# Patient Record
Sex: Male | Born: 1960
Health system: Southern US, Community
[De-identification: ages and names within clinical notes are randomized; demographics above are authoritative.]

## PROBLEM LIST (undated history)

## (undated) DIAGNOSIS — K219 Gastro-esophageal reflux disease without esophagitis: Secondary | ICD-10-CM

## (undated) DIAGNOSIS — G4733 Obstructive sleep apnea (adult) (pediatric): Secondary | ICD-10-CM

## (undated) DIAGNOSIS — E66812 Obesity, class 2: Secondary | ICD-10-CM

## (undated) DIAGNOSIS — E669 Obesity, unspecified: Secondary | ICD-10-CM

## (undated) DIAGNOSIS — K429 Umbilical hernia without obstruction or gangrene: Secondary | ICD-10-CM

## (undated) DIAGNOSIS — R0789 Other chest pain: Secondary | ICD-10-CM

## (undated) DIAGNOSIS — E291 Testicular hypofunction: Secondary | ICD-10-CM

## (undated) DIAGNOSIS — M51369 Other intervertebral disc degeneration, lumbar region without mention of lumbar back pain or lower extremity pain: Secondary | ICD-10-CM

## (undated) DIAGNOSIS — I10 Essential (primary) hypertension: Secondary | ICD-10-CM

## (undated) DIAGNOSIS — M5136 Other intervertebral disc degeneration, lumbar region: Secondary | ICD-10-CM

## (undated) DIAGNOSIS — Z800 Family history of malignant neoplasm of digestive organs: Secondary | ICD-10-CM

## (undated) DIAGNOSIS — Z86010 Personal history of colonic polyps: Secondary | ICD-10-CM

## (undated) DIAGNOSIS — Z860101 Personal history of adenomatous and serrated colon polyps: Secondary | ICD-10-CM

## (undated) DIAGNOSIS — F329 Major depressive disorder, single episode, unspecified: Secondary | ICD-10-CM

## (undated) DIAGNOSIS — F419 Anxiety disorder, unspecified: Secondary | ICD-10-CM

## (undated) DIAGNOSIS — R5382 Chronic fatigue, unspecified: Secondary | ICD-10-CM

## (undated) DIAGNOSIS — M25571 Pain in right ankle and joints of right foot: Secondary | ICD-10-CM

## (undated) DIAGNOSIS — E785 Hyperlipidemia, unspecified: Secondary | ICD-10-CM

## (undated) HISTORY — DX: Pain in right ankle and joints of right foot: M25.571

## (undated) HISTORY — DX: Obesity, class 2: E66.812

## (undated) HISTORY — DX: Essential (primary) hypertension: I10

## (undated) HISTORY — DX: Other intervertebral disc degeneration, lumbar region without mention of lumbar back pain or lower extremity pain: M51.369

## (undated) HISTORY — PX: COLONOSCOPY: SHX174

## (undated) HISTORY — DX: Anxiety disorder, unspecified: F41.9

## (undated) HISTORY — DX: Obstructive sleep apnea (adult) (pediatric): G47.33

## (undated) HISTORY — DX: Obesity, unspecified: E66.9

## (undated) HISTORY — DX: Personal history of colonic polyps: Z86.010

## (undated) HISTORY — DX: Testicular hypofunction: E29.1

## (undated) HISTORY — DX: Major depressive disorder, single episode, unspecified: F32.9

## (undated) HISTORY — DX: Personal history of adenomatous and serrated colon polyps: Z86.0101

## (undated) HISTORY — DX: Other intervertebral disc degeneration, lumbar region: M51.36

## (undated) HISTORY — DX: Gastro-esophageal reflux disease without esophagitis: K21.9

## (undated) HISTORY — PX: COLONOSCOPY W/ POLYPECTOMY: SHX1380

## (undated) HISTORY — DX: Hyperlipidemia, unspecified: E78.5

## (undated) HISTORY — DX: Umbilical hernia without obstruction or gangrene: K42.9

## (undated) HISTORY — DX: Chronic fatigue, unspecified: R53.82

## (undated) HISTORY — DX: Family history of malignant neoplasm of digestive organs: Z80.0

## (undated) HISTORY — PX: POLYPECTOMY: SHX149

---
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
## 2013-09-16 LAB — HM COLONOSCOPY

## 2015-03-06 ENCOUNTER — Ambulatory Visit: Payer: Managed Care, Other (non HMO)

## 2015-12-28 ENCOUNTER — Ambulatory Visit (INDEPENDENT_AMBULATORY_CARE_PROVIDER_SITE_OTHER): Payer: Managed Care, Other (non HMO) | Admitting: Family Medicine

## 2015-12-28 ENCOUNTER — Encounter: Payer: Self-pay | Admitting: Family Medicine

## 2015-12-28 VITALS — BP 120/83 | HR 65 | Temp 98.4°F | Resp 16 | Ht 71.5 in | Wt 252.5 lb

## 2015-12-28 DIAGNOSIS — Z13220 Encounter for screening for lipoid disorders: Secondary | ICD-10-CM

## 2015-12-28 DIAGNOSIS — E291 Testicular hypofunction: Secondary | ICD-10-CM

## 2015-12-28 DIAGNOSIS — E2740 Unspecified adrenocortical insufficiency: Secondary | ICD-10-CM

## 2015-12-28 DIAGNOSIS — R5382 Chronic fatigue, unspecified: Secondary | ICD-10-CM

## 2015-12-28 DIAGNOSIS — S39012A Strain of muscle, fascia and tendon of lower back, initial encounter: Secondary | ICD-10-CM

## 2015-12-28 MED ORDER — "SYRINGE/NEEDLE (DISP) 23G X 1"" 3 ML MISC": 1.0000 | Status: DC

## 2015-12-28 MED ORDER — TESTOSTERONE CYPIONATE 200 MG/ML IM SOLN: INTRAMUSCULAR | Status: DC

## 2015-12-28 MED ORDER — CYCLOBENZAPRINE HCL 10 MG PO TABS: 10.0000 mg | ORAL_TABLET | Freq: Three times a day (TID) | ORAL | Status: DC | PRN

## 2015-12-28 NOTE — Progress Notes (Signed)
Pre visit review using our clinic review tool, if applicable. No additional management support is needed unless otherwise documented below in the visit note. 

## 2015-12-28 NOTE — Progress Notes (Signed)
Office Note 12/31/2015  CC:  Chief Complaint  Patient presents with  . Establish Care  . Annual Exam    Pt is not fasting.     HPI:  Joshua Mcconnell is a 55 y.o.  male who is here to establish care and discuss hypogonadism. Patient's most recent primary MD: Dr. Virgilio Frees at [facility] in [place]. Old records were not reviewed prior to or during today's visit.  1) C/o generalized fatigue, has not seen an MD in a couple years but has been on testosterone replacement as well as hydrocortisone for presumed adrenal insufficiency but pt not exactly sure b/c he was seeing an integrated [facility] doctor who also believed that "everything wrong with you was due to some sort of imbalance in your vitamins and minerals"--per pt report today.  So, not so sure about how well the w/u for adrenal insufficiency was, etc.  He has not been on the hydrocortisone in the last 6 mo.  Has chronic LBP he wants to make me aware of: at least 1-2 years of diffuse LB pain w/out radiculopathy or paresthesias, says he has hx of DDD at L4-5, went through PT at [facility] orth[facility] in the past.  Says he was recently working out at [employer] and felt like he strained L low back again--mild.  Advil alt/w tylenol has been somewhat helpful but he still feels a bothersome tightness in L paraspinous region.  Past Medical History  Diagnosis Date  . Depression   . Hyperlipidemia   . Hypertension   . History of colon polyps   . DDD (degenerative disc disease), lumbar     L4-5 per pt (did PT at [facility])  . Hypogonadism male   . Chronic fatigue     History reviewed. No pertinent past surgical history.  Family History  Problem Relation Age of Onset  . Diabetes Mother   . Arthritis Mother   . Breast cancer Mother   . Heart disease Father   . Stroke Father   . Hypertension Father   . Colon cancer Sister 75  . Arthritis Maternal Grandmother     Social History   Social History  . Marital Status: Married    Spouse Name:  N/A  . Number of Children: N/A  . Years of Education: N/A   Occupational History  . Not on file.   Social History Main Topics  . Smoking status: Never Smoker   . Smokeless tobacco: Never Used  . Alcohol Use: 12.0 oz/week    10 Cans of beer, 10 Standard drinks or equivalent per week  . Drug Use: No  . Sexual Activity: Not on file   Other Topics Concern  . Not on file   Social History Narrative   Married, 2 daughters.   Educ: college 4 yrs   Occupation: [employer] of [employer])   No tobacco.   Alc: 1-2 drinks most days.       Outpatient Encounter Prescriptions as of 12/28/2015  Medication Sig  . hydrocortisone (CORTEF) 20 MG tablet Take 20 mg by mouth 4 (four) times daily. Per pt's integrated care MD [place]  . cyclobenzaprine (FLEXERIL) 10 MG tablet Take 1 tablet (10 mg total) by mouth 3 (three) times daily as needed for muscle spasms.  . SYRINGE-NEEDLE, DISP, 3 ML 23G X 1" 3 ML MISC 1 each by Does not apply route once a week.  . testosterone cypionate (DEPOTESTOSTERONE CYPIONATE) 200 MG/ML injection 1 ml IM q  week  . [DISCONTINUED] cyclobenzaprine (FLEXERIL) 5 MG tablet Take 5 mg by mouth 3 (three) times daily as needed for muscle spasms. Reported on 12/28/2015  . [DISCONTINUED] HYDROcodone-acetaminophen (NORCO/VICODIN) 5-325 MG per tablet Take 1 tablet by mouth every 6 (six) hours as needed for moderate pain. Reported on 12/28/2015  . [DISCONTINUED] naproxen (NAPRELAN) 500 MG 24 hr tablet Take 500 mg by mouth daily with breakfast. Reported on 12/28/2015   No facility-administered encounter medications on file as of 12/28/2015.    No Known Allergies  ROS Review of Systems  Constitutional: Negative for fever and fatigue.  HENT: Negative for congestion and sore throat.   Eyes: Negative for visual disturbance.  Respiratory: Negative for cough.   Cardiovascular: Negative for chest pain.  Gastrointestinal: Negative for nausea and abdominal pain.   Genitourinary: Negative for dysuria.  Musculoskeletal: Negative for back pain and joint swelling.  Skin: Negative for rash.       Reddish/purplish splotch of rash on L side of neck  Neurological: Negative for weakness and headaches.       Left elbow"funny bone" paresthesia-type sx's  Hematological: Negative for adenopathy.    PE; Blood pressure 120/83, pulse 65, temperature 98.4 F (36.9 C), temperature source Oral, resp. rate 16, height 5' 11.5" (1.816 m), weight 252 lb 8 oz (114.533 kg), SpO2 96 %. Gen: Alert, well appearing.  Patient is oriented to person, place, time, and situation. [id number]: no injection, icteris, swelling, or exudate.  EOMI, PERRLA. Mouth: lips without lesion/swelling.  Oral mucosa pink and moist. Oropharynx without erythema, exudate, or swelling.  Neck - No masses or thyromegaly or limitation in range of motion CV: RRR, no m/r/g.   LUNGS: CTA bilat, nonlabored resps, good aeration in all lung fields. ABD: soft, NT/ND EXT: no clubbing, cyanosis, or edema.   L elbow ulnar groove: compression/palpation of ulnar nerve elicits discomfort in the area but not in forearm or hand SKIN: left side of neck with violaceous macular rash about the size of a tennis ball---in the area near his beard.  Pertinent labs:  None today  ASSESSMENT AND PLAN:   New pt; obtain old records.  1) Male hypogonadism: RF'd rx to resume previous testosterone injection dosing: 200 mg IM q week. I have ordered future labs for testost level, CBC, PSA: he'll come in for these labs next week.  2) Chronic fatigue: may simply be due to his high stress life.  Regarding his hx of being on hydrocortisone, will get old records and see what this was based on.  Additionally, check cortisol, TSH, CMET, CBC.  3) Acute low back strain, with hx of L4-5 DDD.   Encouraged pt to continue light stretching/ROM workouts, advil alt/w tylenol, and we'll add cyclobenzaprine 10mg  tid prn.  An After Visit Summary  was printed and given to the patient.  Return in about 6 months (around 06/29/2016) for routine chronic illness f/u: needs lab visit at earliest convenience (fasting).  Signed:  Crissie Sickles, MD           12/31/2015

## 2015-12-31 ENCOUNTER — Other Ambulatory Visit: Payer: Managed Care, Other (non HMO)

## 2015-12-31 ENCOUNTER — Encounter: Payer: Self-pay | Admitting: Family Medicine

## 2016-01-04 ENCOUNTER — Other Ambulatory Visit (INDEPENDENT_AMBULATORY_CARE_PROVIDER_SITE_OTHER): Payer: Managed Care, Other (non HMO)

## 2016-01-04 DIAGNOSIS — E291 Testicular hypofunction: Secondary | ICD-10-CM

## 2016-01-04 DIAGNOSIS — Z13220 Encounter for screening for lipoid disorders: Secondary | ICD-10-CM

## 2016-01-04 DIAGNOSIS — E2740 Unspecified adrenocortical insufficiency: Secondary | ICD-10-CM

## 2016-01-04 LAB — CBC WITH DIFFERENTIAL/PLATELET
BASOS ABS: 64 {cells}/uL (ref 0–200)
Basophils Relative: 1 %
EOS ABS: 64 {cells}/uL (ref 15–500)
EOS PCT: 1 %
HCT: 47.7 % (ref 38.5–50.0)
Hemoglobin: 16.2 g/dL (ref 13.2–17.1)
LYMPHS ABS: 2752 {cells}/uL (ref 850–3900)
Lymphocytes Relative: 43 %
MCH: 29.3 pg (ref 27.0–33.0)
MCHC: 34 g/dL (ref 32.0–36.0)
MCV: 86.4 fL (ref 80.0–100.0)
MONOS PCT: 7 %
MPV: 9.7 fL (ref 7.5–12.5)
Monocytes Absolute: 448 cells/uL (ref 200–950)
NEUTROS ABS: 3072 {cells}/uL (ref 1500–7800)
NEUTROS PCT: 48 %
PLATELETS: 211 10*3/uL (ref 140–400)
RBC: 5.52 MIL/uL (ref 4.20–5.80)
RDW: 13.1 % (ref 11.0–15.0)
WBC: 6.4 10*3/uL (ref 3.8–10.8)

## 2016-01-04 LAB — LIPID PANEL
CHOL/HDL RATIO: 6.3 ratio — AB (ref <=5.0)
Cholesterol: 269 mg/dL — ABNORMAL HIGH (ref 125–200)
HDL: 43 mg/dL (ref >=40)
LDL CALC: 175 mg/dL — AB (ref <130)
Triglycerides: 257 mg/dL — ABNORMAL HIGH (ref <150)
VLDL: 51 mg/dL — ABNORMAL HIGH (ref <30)

## 2016-01-04 LAB — COMPREHENSIVE METABOLIC PANEL
ALBUMIN: 4.1 g/dL (ref 3.6–5.1)
ALT: 34 U/L (ref 9–46)
AST: 23 U/L (ref 10–35)
Alkaline Phosphatase: 35 U/L — ABNORMAL LOW (ref 40–115)
BILIRUBIN TOTAL: 0.6 mg/dL (ref 0.2–1.2)
BUN: 16 mg/dL (ref 7–25)
CHLORIDE: 100 mmol/L (ref 98–110)
CO2: 25 mmol/L (ref 20–31)
CREATININE: 0.97 mg/dL (ref 0.70–1.33)
Calcium: 9.2 mg/dL (ref 8.6–10.3)
Glucose, Bld: 91 mg/dL (ref 65–99)
Potassium: 4.4 mmol/L (ref 3.5–5.3)
SODIUM: 139 mmol/L (ref 135–146)
TOTAL PROTEIN: 6.5 g/dL (ref 6.1–8.1)

## 2016-01-04 LAB — TSH: TSH: 2.63 m[IU]/L (ref 0.40–4.50)

## 2016-01-04 LAB — CORTISOL: Cortisol, Plasma: 12.6 ug/dL

## 2016-01-05 LAB — PSA: PSA: 0.98 ng/mL (ref <=4.00)

## 2016-01-08 LAB — TESTOSTERONE TOTAL,FREE,BIO, MALES
Albumin: 4.1 g/dL (ref 3.6–5.1)
SEX HORMONE BINDING: 21 nmol/L (ref 10–50)
TESTOSTERONE BIOAVAILABLE: 251 ng/dL (ref 130.5–681.7)
TESTOSTERONE FREE: 133.3 pg/mL (ref 47.0–244.0)
Testosterone: 634 ng/dL (ref 250–827)

## 2016-01-09 ENCOUNTER — Encounter: Payer: Self-pay | Admitting: Family Medicine

## 2016-01-09 ENCOUNTER — Telehealth: Payer: Self-pay | Admitting: Family Medicine

## 2016-01-09 NOTE — Telephone Encounter (Signed)
Pt does NOT need to take cortisol/cortisone.

## 2016-01-09 NOTE — Telephone Encounter (Signed)
Pt called back wanting to know if you thought he had to take cortisol since his levels were normal?  Please advise.    ** patient states he does not need call back unless he needs to take RX. **

## 2016-06-17 ENCOUNTER — Encounter: Payer: Self-pay | Admitting: Family Medicine

## 2016-06-17 ENCOUNTER — Ambulatory Visit (INDEPENDENT_AMBULATORY_CARE_PROVIDER_SITE_OTHER): Payer: Managed Care, Other (non HMO) | Admitting: Family Medicine

## 2016-06-17 VITALS — BP 125/85 | HR 69 | Temp 98.3°F | Resp 20 | Wt 247.5 lb

## 2016-06-17 DIAGNOSIS — S39012A Strain of muscle, fascia and tendon of lower back, initial encounter: Secondary | ICD-10-CM | POA: Diagnosis not present

## 2016-06-17 DIAGNOSIS — Z23 Encounter for immunization: Secondary | ICD-10-CM | POA: Diagnosis not present

## 2016-06-17 MED ORDER — METHYLPREDNISOLONE ACETATE 80 MG/ML IJ SUSP
80.0000 mg | Freq: Once | INTRAMUSCULAR | Status: AC
  Administered 2016-06-17: 80 mg via INTRAMUSCULAR

## 2016-06-17 MED ORDER — PREDNISONE 20 MG PO TABS: ORAL_TABLET | ORAL | 0 refills | Status: DC

## 2016-06-17 MED ORDER — BACLOFEN 20 MG PO TABS: 20.0000 mg | ORAL_TABLET | Freq: Three times a day (TID) | ORAL | 0 refills | Status: DC

## 2016-06-17 NOTE — Patient Instructions (Signed)
Heat therapy a few times a day if able.  Prednisone taper to start tomorrow.  Baclofen is a muscle relaxer you can start today. It has less sedation properties.  No lifting, start stretches in a few days (use pain as your guide).   Follow up with PCP in 2-4 weeks if not improving

## 2016-06-17 NOTE — Progress Notes (Signed)
Joshua Mcconnell , [birth year], 55 y.o., male MRN: [id number] Patient Care Team    Relationship Specialty Notifications Start End  Tammi Sou, MD PCP - General Family Medicine  12/28/15     CC: Back pain  Subjective: Pt presents for an acute OV with complaints of back pain of 2 days duration.  Associated symptoms include pain his lower back without radiation.  Pt states he was moving, lifting heavy furniture over the last few days and pulled something in his back. The pain is worse with transitioning from siting to standing. He has a history of lumbar DDD. He is taking advil scheduled.   No Known Allergies Social History  Substance Use Topics  . Smoking status: Never Smoker  . Smokeless tobacco: Never Used  . Alcohol use 12.0 oz/week    10 Cans of beer, 10 Standard drinks or equivalent per week   Past Medical History:  Diagnosis Date  . Chronic fatigue   . DDD (degenerative disc disease), lumbar    L4-5 per pt (did PT at [facility])  . Depression   . History of colon polyps   . Hyperlipidemia   . Hypertension   . Hypogonadism male    History reviewed. No pertinent surgical history. Family History  Problem Relation Age of Onset  . Diabetes Mother   . Arthritis Mother   . Breast cancer Mother   . Heart disease Father   . Stroke Father   . Hypertension Father   . Colon cancer Sister 42  . Arthritis Maternal Grandmother      Medication List       Accurate as of 06/17/16  2:38 PM. Always use your most recent med list.          cyclobenzaprine 10 MG tablet Commonly known as:  FLEXERIL Take 1 tablet (10 mg total) by mouth 3 (three) times daily as needed for muscle spasms.   hydrocortisone 20 MG tablet Commonly known as:  CORTEF Take 20 mg by mouth 4 (four) times daily. Per pt's integrated care MD [place]   SYRINGE-NEEDLE (DISP) 3 ML 23G X 1" 3 ML Misc 1 each by Does not apply route once a week.   testosterone cypionate 200 MG/ML injection Commonly known as:   DEPOTESTOSTERONE CYPIONATE 1 ml IM q week       No results found for this or any previous visit (from the past 24 hour(s)). No results found.   ROS: Negative, with the exception of above mentioned in HPI   Objective:  BP 125/85 (BP Location: Right Arm, Patient Position: Sitting, Cuff Size: Large)   Pulse 69   Temp 98.3 F (36.8 C)   Resp 20   Wt 247 lb 8 oz (112.3 kg)   SpO2 98%   BMI 34.04 kg/m  Body mass index is 34.04 kg/m. Gen: Afebrile. No acute distress. Nontoxic in appearance, well developed, well nourished.  HENT: AT. [place]. MMM Eyes:Pupils Equal Round Reactive to light, Extraocular movements intact,  Conjunctiva without redness, discharge or icterus. MSK: no erythema, no soft tissue swelling. No spinal or SI TTP. Mild muscle spasm right lower lumbar. FROM with discomfort on flexion. Neg  SLR and fabre. NV intact distally.  Neuro: Normal gait. PERLA. EOMi. Alert. Oriented x3. Muscle strength 5/5 bilateral lower extremity. DTRs equal bilaterally. Psych: Normal affect, dress and demeanor. Normal speech. Normal thought content and judgment.  Assessment/Plan: Joshua Mcconnell is a 55 y.o. male present for acute OV for  Need for prophylactic vaccination and inoculation against influenza - Flu Vaccine QUAD 36+ mos PF IM (Fluarix & Fluzone Quad PF) Lumbar strain, initial encounter - IM depo medrol  Baclofen, prednisone taper. Heat therapy.  Rest, no lifting.  Stretches to start in a few days.  F/U 2-4 weeks with PCP if no improvement.   > 25 minutes spent with patient, >50% of time spent face to face    electronically signed by:  Howard Pouch, DO  [place]

## 2016-06-27 ENCOUNTER — Encounter: Payer: Self-pay | Admitting: Family Medicine

## 2016-06-27 ENCOUNTER — Ambulatory Visit (INDEPENDENT_AMBULATORY_CARE_PROVIDER_SITE_OTHER): Payer: Managed Care, Other (non HMO) | Admitting: Family Medicine

## 2016-06-27 VITALS — BP 114/81 | HR 73 | Temp 97.6°F | Resp 16 | Wt 242.0 lb

## 2016-06-27 DIAGNOSIS — E291 Testicular hypofunction: Secondary | ICD-10-CM | POA: Diagnosis not present

## 2016-06-27 LAB — CBC WITH DIFFERENTIAL/PLATELET
BASOS ABS: 0 {cells}/uL (ref 0–200)
Basophils Relative: 0 %
EOS PCT: 1 %
Eosinophils Absolute: 117 cells/uL (ref 15–500)
HCT: 47.4 % (ref 38.5–50.0)
Hemoglobin: 16.2 g/dL (ref 13.2–17.1)
LYMPHS PCT: 39 %
Lymphs Abs: 4563 cells/uL — ABNORMAL HIGH (ref 850–3900)
MCH: 30.7 pg (ref 27.0–33.0)
MCHC: 34.2 g/dL (ref 32.0–36.0)
MCV: 89.8 fL (ref 80.0–100.0)
MONOS PCT: 7 %
MPV: 9.4 fL (ref 7.5–12.5)
Monocytes Absolute: 819 cells/uL (ref 200–950)
NEUTROS PCT: 53 %
Neutro Abs: 6201 cells/uL (ref 1500–7800)
PLATELETS: 227 10*3/uL (ref 140–400)
RBC: 5.28 MIL/uL (ref 4.20–5.80)
RDW: 13.6 % (ref 11.0–15.0)
WBC: 11.7 10*3/uL — ABNORMAL HIGH (ref 3.8–10.8)

## 2016-06-27 MED ORDER — TESTOSTERONE CYPIONATE 200 MG/ML IM SOLN: INTRAMUSCULAR | 0 refills | Status: DC

## 2016-06-27 NOTE — Progress Notes (Signed)
Pre visit review using our clinic review tool, if applicable. No additional management support is needed unless otherwise documented below in the visit note. 

## 2016-06-27 NOTE — Progress Notes (Signed)
OFFICE VISIT  06/27/2016   CC:  Chief Complaint  Patient presents with  . Follow-up    hypogonadism   HPI:    Patient is a 55 y.o. Caucasian male who presents for f/u. I last saw him 6 mo ago for new pt visit, main issue has been hypogonadism. He is off all meds now except testosterone injections.  Diet is better.  Still not exercising.  Has lost some wt, is shooting for a wt of 220 lbs. Energy level is good, libido improved on testost 200mg  IM q week.  He feels like his abdominal fat gets leaner while on testosterone. Most recent injection was 1 week ago.   Past Medical History:  Diagnosis Date  . Chronic fatigue   . DDD (degenerative disc disease), lumbar    L4-5 per pt (did PT at [facility])  . Depression   . History of colon polyps   . Hyperlipidemia   . Hypertension   . Hypogonadism male     History reviewed. No pertinent surgical history.  Outpatient Medications Prior to Visit  Medication Sig Dispense Refill  . testosterone cypionate (DEPOTESTOSTERONE CYPIONATE) 200 MG/ML injection 1 ml IM q week 20 mL 0  . SYRINGE-NEEDLE, DISP, 3 ML 23G X 1" 3 ML MISC 1 each by Does not apply route once a week. (Patient not taking: Reported on 06/27/2016) 100 each 11  . baclofen (LIORESAL) 20 MG tablet Take 1 tablet (20 mg total) by mouth 3 (three) times daily. (Patient not taking: Reported on 06/27/2016) 30 each 0  . cyclobenzaprine (FLEXERIL) 10 MG tablet Take 1 tablet (10 mg total) by mouth 3 (three) times daily as needed for muscle spasms. (Patient not taking: Reported on 06/27/2016) 30 tablet 3  . predniSONE (DELTASONE) 20 MG tablet 60 mg x3d, 40 mg x3d, 20 mg x2d, 10 mg x2d (Patient not taking: Reported on 06/27/2016) 18 tablet 0   No facility-administered medications prior to visit.     No Known Allergies  ROS As per HPI  PE: Blood pressure 114/81, pulse 73, temperature 97.6 F (36.4 C), temperature source Temporal, resp. rate 16, weight 242 lb (109.8 kg), SpO2 95  %. Gen: Alert, well appearing.  Patient is oriented to person, place, time, and situation. CV: RRR, no m/r/g.   LUNGS: CTA bilat, nonlabored resps, good aeration in all lung fields. EXT: no clubbing, cyanosis, or edema.   LABS:  Lab Results  Component Value Date   TSH 2.63 01/04/2016   Lab Results  Component Value Date   WBC 6.4 01/04/2016   HGB 16.2 01/04/2016   HCT 47.7 01/04/2016   MCV 86.4 01/04/2016   PLT 211 01/04/2016   Lab Results  Component Value Date   CREATININE 0.97 01/04/2016   BUN 16 01/04/2016   NA 139 01/04/2016   K 4.4 01/04/2016   CL 100 01/04/2016   CO2 25 01/04/2016   Lab Results  Component Value Date   ALT 34 01/04/2016   AST 23 01/04/2016   ALKPHOS 35 (L) 01/04/2016   BILITOT 0.6 01/04/2016   Lab Results  Component Value Date   CHOL 269 (H) 01/04/2016   Lab Results  Component Value Date   HDL 43 01/04/2016   Lab Results  Component Value Date   LDLCALC 175 (H) 01/04/2016   Lab Results  Component Value Date   TRIG 257 (H) 01/04/2016   Lab Results  Component Value Date   CHOLHDL 6.3 (H) 01/04/2016   Lab Results  Component Value Date   PSA 0.98 01/04/2016   Lab Results  Component Value Date   TESTOSTERONE 634 01/04/2016   IMPRESSION AND PLAN:  Hypogonadism: doing well on 200 mg IM q week. Warmed pt that this is definitely high dosing and we need to closely monitor Hb and I'll also check a testost level today.  At next f/u for CPE in 6 mo we'll check PSA with a health panel.  An After Visit Summary was printed and given to the patient.  FOLLOW UP: Return in about 6 months (around 12/25/2016) for annual CPE (fasting).  Signed:  Crissie Sickles, MD           06/27/2016

## 2016-06-28 LAB — TESTOSTERONE: TESTOSTERONE: 676 ng/dL (ref 250–827)

## 2016-11-11 ENCOUNTER — Ambulatory Visit (INDEPENDENT_AMBULATORY_CARE_PROVIDER_SITE_OTHER): Payer: Managed Care, Other (non HMO) | Admitting: Physician Assistant

## 2016-11-11 ENCOUNTER — Encounter: Payer: Self-pay | Admitting: Physician Assistant

## 2016-11-11 VITALS — BP 120/84 | HR 70 | Temp 98.6°F | Resp 14 | Ht 71.5 in | Wt 257.0 lb

## 2016-11-11 DIAGNOSIS — S93401A Sprain of unspecified ligament of right ankle, initial encounter: Secondary | ICD-10-CM

## 2016-11-11 MED ORDER — MELOXICAM 15 MG PO TABS: 15.0000 mg | ORAL_TABLET | Freq: Every day | ORAL | 0 refills | Status: DC

## 2016-11-11 NOTE — Progress Notes (Signed)
Patient presents to clinic today c/o 2 weeks of pain in R foot and ankle. States first noted at the end of a day, noting pain of R lateral ankle described as throbbing in nature, 6/10.Marland Kitchen Denies trauma or injury. Denies bruising or redness of skin. Endorses mild swelling of lateral ankle by the end of the day. Improved by morning along with pain. Advil with some relief of symptoms.   Past Medical History:  Diagnosis Date  . Chronic fatigue   . DDD (degenerative disc disease), lumbar    L4-5 per pt (did PT at [facility])  . Depression   . History of colon polyps   . Hyperlipidemia   . Hypertension   . Hypogonadism male     Current Outpatient Prescriptions on File Prior to Visit  Medication Sig Dispense Refill  . SYRINGE-NEEDLE, DISP, 3 ML 23G X 1" 3 ML MISC 1 each by Does not apply route once a week. 100 each 11  . testosterone cypionate (DEPOTESTOSTERONE CYPIONATE) 200 MG/ML injection 1 ml IM q week 20 mL 0   No current facility-administered medications on file prior to visit.     No Known Allergies  Family History  Problem Relation Age of Onset  . Diabetes Mother   . Arthritis Mother   . Breast cancer Mother   . Heart disease Father   . Stroke Father   . Hypertension Father   . Colon cancer Sister 57  . Arthritis Maternal Grandmother     Social History   Social History  . Marital status: Married    Spouse name: N/A  . Number of children: N/A  . Years of education: N/A   Social History Main Topics  . Smoking status: Never Smoker  . Smokeless tobacco: Never Used  . Alcohol use 12.0 oz/week    10 Cans of beer, 10 Standard drinks or equivalent per week  . Drug use: No  . Sexual activity: Not Asked   Other Topics Concern  . None   Social History Narrative   Married, 2 daughters.   Educ: college 4 yrs   Occupation: [employer])   No tobacco.   Alc: 1-2 drinks most days.      Review of Systems - See HPI.  All other ROS are  negative.  BP 120/84   Pulse 70   Temp 98.6 F (37 C) (Oral)   Resp 14   Ht 5' 11.5" (1.816 m)   Wt 257 lb (116.6 kg)   SpO2 94%   BMI 35.34 kg/m   Physical Exam  Constitutional: He is oriented to person, place, and time and well-developed, well-nourished, and in no distress.  HENT:  Head: Normocephalic and atraumatic.  Eyes: Conjunctivae are normal.  Cardiovascular: Normal rate, regular rhythm, normal heart sounds and intact distal pulses.   Pulmonary/Chest: Effort normal and breath sounds normal. No respiratory distress. He has no wheezes. He has no rales. He exhibits no tenderness.  Musculoskeletal:       Right ankle: He exhibits normal range of motion, no swelling, no deformity and normal pulse. Tenderness. AITFL tenderness found. No lateral malleolus, no medial malleolus and no CF ligament tenderness found. Achilles tendon normal.  Neurological: He is alert and oriented to person, place, and time.  Skin: Skin is warm and dry. No rash noted.  Psychiatric: Affect normal.  Vitals reviewed.  Assessment/Plan: 1. Sprain of right ankle, unspecified ligament, initial encounter RX mobic. RICE. Ankle brace offered to  patient but he refuses. Will get from medical supply store instead. If not improving will need imaging to r/o subtle fracture although this is highly doubtful.     Leeanne Rio, PA-C

## 2016-11-11 NOTE — Patient Instructions (Addendum)
Wear ankle brace daily. Can take off when resting at home. Wear supportive footwear (laceups). When resting, elevate leg. Try the stretches below.  Use the Mobic once daily with food. Tylenol if needed for breakthrough. Symptoms should gradually improve. If not then we will need imaging or sports medicine assessment.    Ankle Sprain, Phase I Rehab Ask your health care provider which exercises are safe for you. Do exercises exactly as told by your health care provider and adjust them as directed. It is normal to feel mild stretching, pulling, tightness, or discomfort as you do these exercises, but you should stop right away if you feel sudden pain or your pain gets worse.Do not begin these exercises until told by your health care provider. Stretching and range of motion exercises These exercises warm up your muscles and joints and improve the movement and flexibility of your lower leg and ankle. These exercises also help to relieve pain and stiffness. Exercise A: Gastroc and soleus stretch   1. Sit on the floor with your left / right leg extended. 2. Loop a belt or towel around the ball of your left / right foot. The ball of your foot is on the walking surface, right under your toes. 3. Keep your left / right ankle and foot relaxed and keep your knee straight while you use the belt or towel to pull your foot toward you. You should feel a gentle stretch behind your calf or knee. 4. Hold this position for __________ seconds, then release to the starting position. Repeat the exercise with your knee bent. You can put a pillow or a rolled bath towel under your knee to support it. You should feel a stretch deep in your calf or at your Achilles tendon. Repeat each stretch __________ times. Complete these stretches __________ times a day. Exercise B: Ankle alphabet   1. Sit with your left / right leg supported at the lower leg.  Do not rest your foot on anything.  Make sure your foot has room to  move freely. 2. Think of your left / right foot as a paintbrush, and move your foot to trace each letter of the alphabet in the air. Keep your hip and knee still while you trace. Make the letters as large as you can without feeling discomfort. 3. Trace every letter from A to Z. Repeat __________ times. Complete this exercise __________ times a day. Strengthening exercises These exercises build strength and endurance in your ankle and lower leg. Endurance is the ability to use your muscles for a long time, even after they get tired. Exercise C: Dorsiflexors   1. Secure a rubber exercise band or tube to an object, such as a table leg, that will stay still when the band is pulled. Secure the other end around your left / right foot. 2. Sit on the floor facing the object, with your left / right leg extended. The band or tube should be slightly tense when your foot is relaxed. 3. Slowly bring your foot toward you, pulling the band tighter. 4. Hold this position for __________ seconds. 5. Slowly return your foot to the starting position. Repeat __________ times. Complete this exercise __________ times a day. Exercise D: Plantar flexors   1. Sit on the floor with your left / right leg extended. 2. Loop a rubber exercise tube or band around the ball of your left / right foot. The ball of your foot is on the walking surface, right under your toes.  Hold the ends of the band or tube in your hands.  The band or tube should be slightly tense when your foot is relaxed. 3. Slowly point your foot and toes downward, pushing them away from you. 4. Hold this position for __________ seconds. 5. Slowly return your foot to the starting position. Repeat __________ times. Complete this exercise __________ times a day. Exercise E: Evertors  1. Sit on the floor with your legs straight out in front of you. 2. Loop a rubber exercise band or tube around the ball of your left / right foot. The ball of your foot is on  the walking surface, right under your toes.  Hold the ends of the band in your hands, or secure the band to a stable object.  The band or tube should be slightly tense when your foot is relaxed. 3. Slowly push your foot outward, away from your other leg. 4. Hold this position for __________ seconds. 5. Slowly return your foot to the starting position. Repeat __________ times. Complete this exercise __________ times a day. This information is not intended to replace advice given to you by your health care provider. Make sure you discuss any questions you have with your health care provider. Document Released: 02/26/2005 Document Revised: 04/03/2016 Document Reviewed: 06/11/2015 Elsevier Interactive Patient Education  2017 [employer].

## 2016-11-11 NOTE — Progress Notes (Signed)
Pre visit review using our clinic review tool, if applicable. No additional management support is needed unless otherwise documented below in the visit note. 

## 2016-12-07 ENCOUNTER — Other Ambulatory Visit: Payer: Self-pay | Admitting: Physician Assistant

## 2016-12-14 ENCOUNTER — Other Ambulatory Visit: Payer: Self-pay | Admitting: Physician Assistant

## 2016-12-15 NOTE — Telephone Encounter (Signed)
[facility].  RF request for meloxicam LOV: 11/11/16 seen by Leeanne Rio, [place] Next ov: None Last written: 11/11/16 #15 w/ Marcelyn Ditty - Rx by Einar Pheasant  Please advise. Thanks.

## 2017-02-10 ENCOUNTER — Telehealth: Payer: Self-pay

## 2017-02-10 NOTE — Telephone Encounter (Signed)
Refill request received from pharmacy for Testosterone. Patient last seen 06/27/16 and labs last drawn 06/27/16. Please advise.

## 2017-02-12 ENCOUNTER — Telehealth: Payer: Self-pay | Admitting: Family Medicine

## 2017-02-12 NOTE — Telephone Encounter (Signed)
SW [facility] at [facility] and advised him that Dr. Anitra Lauth is out of the office til Monday. Rx may not be sent in til then. SW pt and pt stated that its okay to wait til Monday.

## 2017-02-12 NOTE — Telephone Encounter (Signed)
Joshua Mcconnell with [facility] calling to request refill of testosterone cypionate (DEPOTESTOSTERONE CYPIONATE) 200 MG/ML injection.  Pharmacy:  [id number] [place], [place] [phone] (Phone) [phone] (Fax)

## 2017-02-13 ENCOUNTER — Other Ambulatory Visit: Payer: Self-pay | Admitting: Family Medicine

## 2017-02-13 NOTE — Telephone Encounter (Signed)
[facility].  RF request for meloxicam LOV: 06/27/16 Next ov: None Last written: 12/15/16 #30 w/ 1RF  Please advise. Thanks.

## 2017-02-15 ENCOUNTER — Other Ambulatory Visit: Payer: Self-pay | Admitting: Family Medicine

## 2017-02-15 MED ORDER — TESTOSTERONE CYPIONATE 200 MG/ML IM SOLN: INTRAMUSCULAR | 0 refills | Status: DC

## 2017-02-15 NOTE — Telephone Encounter (Signed)
Pt due for repeat lab work--at last visit 06/2016 I had asked him to f/u in 6 mo for fasting CPE--pls have him schedule this o/v sometime in the next 1 mo and I'll do his labs at that time.  In the meantime, I will print rx for his testosterone.--thx

## 2017-02-16 NOTE — Telephone Encounter (Signed)
Patient notified and verbalized understanding.  Appointment scheduled for 03/11/17 for CPE.

## 2017-03-11 ENCOUNTER — Encounter: Payer: Self-pay | Admitting: Family Medicine

## 2017-03-11 ENCOUNTER — Ambulatory Visit (INDEPENDENT_AMBULATORY_CARE_PROVIDER_SITE_OTHER): Payer: Managed Care, Other (non HMO) | Admitting: Family Medicine

## 2017-03-11 VITALS — BP 123/73 | HR 64 | Temp 98.4°F | Resp 16 | Ht 71.0 in | Wt 254.0 lb

## 2017-03-11 DIAGNOSIS — M19071 Primary osteoarthritis, right ankle and foot: Secondary | ICD-10-CM

## 2017-03-11 DIAGNOSIS — Z0001 Encounter for general adult medical examination with abnormal findings: Secondary | ICD-10-CM

## 2017-03-11 DIAGNOSIS — Z23 Encounter for immunization: Secondary | ICD-10-CM | POA: Diagnosis not present

## 2017-03-11 DIAGNOSIS — Z9189 Other specified personal risk factors, not elsewhere classified: Secondary | ICD-10-CM | POA: Diagnosis not present

## 2017-03-11 DIAGNOSIS — M25571 Pain in right ankle and joints of right foot: Secondary | ICD-10-CM

## 2017-03-11 DIAGNOSIS — Z860101 Personal history of adenomatous and serrated colon polyps: Secondary | ICD-10-CM

## 2017-03-11 DIAGNOSIS — L819 Disorder of pigmentation, unspecified: Secondary | ICD-10-CM

## 2017-03-11 DIAGNOSIS — Z800 Family history of malignant neoplasm of digestive organs: Secondary | ICD-10-CM

## 2017-03-11 DIAGNOSIS — Z1159 Encounter for screening for other viral diseases: Secondary | ICD-10-CM

## 2017-03-11 DIAGNOSIS — E291 Testicular hypofunction: Secondary | ICD-10-CM

## 2017-03-11 DIAGNOSIS — Z0000 Encounter for general adult medical examination without abnormal findings: Secondary | ICD-10-CM

## 2017-03-11 DIAGNOSIS — Z125 Encounter for screening for malignant neoplasm of prostate: Secondary | ICD-10-CM | POA: Diagnosis not present

## 2017-03-11 DIAGNOSIS — L818 Other specified disorders of pigmentation: Secondary | ICD-10-CM

## 2017-03-11 DIAGNOSIS — Z86010 Personal history of colonic polyps: Secondary | ICD-10-CM | POA: Diagnosis not present

## 2017-03-11 HISTORY — DX: Pain in right ankle and joints of right foot: M25.571

## 2017-03-11 NOTE — Patient Instructions (Signed)

## 2017-03-11 NOTE — Progress Notes (Addendum)
Office Note 03/11/2017  CC:  Chief Complaint  Patient presents with  . Annual Exam    Pt is not fasting.     HPI:  Joshua Mcconnell is a 56 y.o. White male who is here for annual health maintenance exam. Last PO intake was about 6-7 hours ago: coffee with cream/sugar.  Decided to go ahead and do blood work today. Most recent testost injection was about 2 weeks ago.  He forgot to take it last week.  C/o R ankle swelling and pain in anterior aspect of joint, + stiffness particularly in mornings that takes at least 3--60 min to get better.  Has been going on about 6 wks.  Was seen in another [place] office and rx'd meloxicam. He says this helps some but he wants to know what the underlying problem is with his ankle. Has mild roving musculoskeletal pains -"because I'm growing old" but denies hx of joint pain/swelling similar to that with R ankle lately.  Denies outright redness to the ankle.  No hx of trauma or inury that he recalls.  He is a runner but has had to quit running b/c of this. No fevers or new rashes.  He has chronically noted a dull reddish discoloration of his skin on L side of neck for years.   Past Medical History:  Diagnosis Date  . Chronic fatigue   . DDD (degenerative disc disease), lumbar    L4-5 per pt (did PT at [facility] orth[facility])  . Depression   . History of colon polyps   . Hyperlipidemia   . Hypertension   . Hypogonadism male   . Obesity, Class II, BMI 35-39.9     History reviewed. No pertinent surgical history.  Family History  Problem Relation Age of Onset  . Diabetes Mother   . Arthritis Mother   . Breast cancer Mother   . Heart disease Father   . Stroke Father   . Hypertension Father   . Colon cancer Sister 15  . Arthritis Maternal Grandmother     Social History   Social History  . Marital status: Married    Spouse name: N/A  . Number of children: N/A  . Years of education: N/A   Occupational History  . Not on file.   Social History Main  Topics  . Smoking status: Never Smoker  . Smokeless tobacco: Never Used  . Alcohol use 12.0 oz/week    10 Cans of beer, 10 Standard drinks or equivalent per week  . Drug use: No  . Sexual activity: Not on file   Other Topics Concern  . Not on file   Social History Narrative   Married, 2 daughters.   Educ: college 4 yrs   Occupation: [employer])   No tobacco.   Alc: 1-2 drinks most days.       Outpatient Medications Prior to Visit  Medication Sig Dispense Refill  . meloxicam (MOBIC) 15 MG tablet TAKE 1 TABLET BY MOUTH EVERY DAY 30 tablet 1  . SYRINGE-NEEDLE, DISP, 3 ML 23G X 1" 3 ML MISC 1 each by Does not apply route once a week. 100 each 11  . testosterone cypionate (DEPOTESTOSTERONE CYPIONATE) 200 MG/ML injection 1 ml IM q week 20 mL 0   No facility-administered medications prior to visit.     No Known Allergies  ROS: Review of Systems  Constitutional: Negative for appetite change, chills, fatigue and fever.  HENT: Negative for congestion, dental problem, ear  pain and sore throat.   Eyes: Negative for discharge, redness and visual disturbance.  Respiratory: Negative for cough, chest tightness, shortness of breath and wheezing.   Cardiovascular: Negative for chest pain, palpitations and leg swelling.  Gastrointestinal: Negative for abdominal pain, blood in stool, diarrhea, nausea and vomiting.  Genitourinary: Negative for difficulty urinating, dysuria, flank pain, frequency, hematuria and urgency.  Musculoskeletal: Positive for arthralgias (R ankle, see HPI). Negative for back pain, joint swelling, myalgias and neck stiffness.  Skin: Negative for pallor and rash.  Neurological: Negative for dizziness, speech difficulty, weakness and headaches.  Hematological: Negative for adenopathy. Does not bruise/bleed easily.  Psychiatric/Behavioral: Negative for confusion and sleep disturbance. The patient is not nervous/anxious.     PE; Blood  pressure 123/73, pulse 64, temperature 98.4 F (36.9 C), temperature source Oral, resp. rate 16, height _0  (1.803 m), weight 254 lb (115.2 kg), SpO2 94 %. Body mass index is 35.43 kg/m.  Gen: Alert, well appearing.  Patient is oriented to person, place, time, and situation. AFFECT: pleasant, lucid thought and speech. ENT: Ears: EACs clear, normal epithelium.  TMs with good light reflex and landmarks bilaterally.  Eyes: no injection, icteris, swelling, or exudate.  EOMI, PERRLA. Nose: no drainage or turbinate edema/swelling.  No injection or focal lesion.  Mouth: lips without lesion/swelling.  Oral mucosa pink and moist.  Dentition intact and without obvious caries or gingival swelling.  Oropharynx without erythema, exudate, or swelling.  Neck: supple/nontender.  No LAD, mass, or TM.  Carotid pulses 2+ bilaterally, without bruits. CV: RRR, no m/r/g.   LUNGS: CTA bilat, nonlabored resps, good aeration in all lung fields. ABD: soft, NT, ND, BS normal.  No hepatospenomegaly or mass.  No bruits. EXT: no clubbing, cyanosis, or edema.  Musculoskeletal:  Right ankle with mild periarticular ankle swelling--almost feels more fatty in nature than fluid.  Tender (mild) over anterior aspect of ankle/foot intersection.  ROM intact.  No warmth or erythema. Skin - no sores or suspicious lesions. Left side of neck with a patch of .mild ruddy/dull red hyperpigmentation Rectal exam: negative without mass, lesions or tenderness, PROSTATE EXAM: smooth and symmetric without nodules or tenderness.    Pertinent labs:  Lab Results  Component Value Date   TSH 2.63 01/04/2016   Lab Results  Component Value Date   WBC 11.7 (H) 06/27/2016   HGB 16.2 06/27/2016   HCT 47.4 06/27/2016   MCV 89.8 06/27/2016   PLT 227 06/27/2016   Lab Results  Component Value Date   CREATININE 0.97 01/04/2016   BUN 16 01/04/2016   NA 139 01/04/2016   K 4.4 01/04/2016   CL 100 01/04/2016   CO2 25 01/04/2016   Lab Results   Component Value Date   ALT 34 01/04/2016   AST 23 01/04/2016   ALKPHOS 35 (L) 01/04/2016   BILITOT 0.6 01/04/2016   Lab Results  Component Value Date   CHOL 269 (H) 01/04/2016   Lab Results  Component Value Date   HDL 43 01/04/2016   Lab Results  Component Value Date   LDLCALC 175 (H) 01/04/2016   Lab Results  Component Value Date   TRIG 257 (H) 01/04/2016   Lab Results  Component Value Date   CHOLHDL 6.3 (H) 01/04/2016   Lab Results  Component Value Date   PSA 0.98 01/04/2016   Lab Results  Component Value Date   TESTOSTERONE 676 06/27/2016    ASSESSMENT AND PLAN:   1) R ankle arthritis: will further  evaluate this with ankle plain films, ESR, CRP, Rh factor, CCP ab, and uric acid level. He'll continue meloxicam 5m qd prn.  2) Left sided neck macular lesion: I don't know what this is.  Referral to dermatologist ordered.  3) Health maintenance exam: Reviewed age and gender appropriate health maintenance issues (prudent diet, regular exercise, health risks of tobacco and excessive alcohol, use of seatbelts, fire alarms in home, use of sunscreen).  Also reviewed age and gender appropriate health screening as well as vaccine recommendations. Vaccines: Tdap today. Labs: fasting HP + testosterone+PSA.  Additionally, to further evaluate his recent R ankle arthritis I ordered a Rh factor, CRP, ESR, CCP ab, and uric acid level.  Also, will order R ankle X-ray. Prostate cancer screening: DRE normal today, PSA drawn. Colon ca screening: hx of adenomatous polyp x 1 at initial screening colonoscopy 3-4 yrs ago, also SISTER with hx of colon cancer.  [facility] for [facility] risk [facility].  Pt was unable to recall today the name of the MD or office [place] where he got his first colonoscopy.  An After Visit Summary was printed and given to the patient.  FOLLOW UP:  Return in about 6 months (around 09/11/2017) for f/u low testosterone.  Signed:  PCrissie Sickles MD           03/11/2017

## 2017-03-11 NOTE — Addendum Note (Signed)
Addended by: Onalee Hua on: 03/11/2017 01:45 PM   Modules accepted: Orders

## 2017-03-12 ENCOUNTER — Encounter: Payer: Self-pay | Admitting: Family Medicine

## 2017-03-12 LAB — HEPATITIS C ANTIBODY: HCV Ab: NONREACTIVE

## 2017-03-12 LAB — LIPID PANEL
CHOL/HDL RATIO: 5 ratio — AB (ref <5.0)
CHOLESTEROL: 281 mg/dL — AB (ref <200)
HDL: 56 mg/dL (ref >40)
LDL Cholesterol: 197 mg/dL — ABNORMAL HIGH (ref <100)
Triglycerides: 140 mg/dL (ref <150)
VLDL: 28 mg/dL (ref <30)

## 2017-03-12 LAB — C-REACTIVE PROTEIN: CRP: 1 mg/L (ref <8.0)

## 2017-03-12 LAB — CYCLIC CITRUL PEPTIDE ANTIBODY, IGG

## 2017-03-12 LAB — CBC WITH DIFFERENTIAL/PLATELET
BASOS ABS: 61 {cells}/uL (ref 0–200)
Basophils Relative: 1 %
Eosinophils Absolute: 61 cells/uL (ref 15–500)
Eosinophils Relative: 1 %
HEMATOCRIT: 46.6 % (ref 38.5–50.0)
HEMOGLOBIN: 15.9 g/dL (ref 13.2–17.1)
LYMPHS ABS: 2562 {cells}/uL (ref 850–3900)
LYMPHS PCT: 42 %
MCH: 30.8 pg (ref 27.0–33.0)
MCHC: 34.1 g/dL (ref 32.0–36.0)
MCV: 90.3 fL (ref 80.0–100.0)
MONO ABS: 488 {cells}/uL (ref 200–950)
MPV: 9.6 fL (ref 7.5–12.5)
Monocytes Relative: 8 %
NEUTROS PCT: 48 %
Neutro Abs: 2928 cells/uL (ref 1500–7800)
Platelets: 243 10*3/uL (ref 140–400)
RBC: 5.16 MIL/uL (ref 4.20–5.80)
RDW: 14 % (ref 11.0–15.0)
WBC: 6.1 10*3/uL (ref 3.8–10.8)

## 2017-03-12 LAB — COMPREHENSIVE METABOLIC PANEL
ALBUMIN: 4.4 g/dL (ref 3.6–5.1)
ALT: 33 U/L (ref 9–46)
AST: 26 U/L (ref 10–35)
Alkaline Phosphatase: 38 U/L — ABNORMAL LOW (ref 40–115)
BILIRUBIN TOTAL: 1.2 mg/dL (ref 0.2–1.2)
BUN: 16 mg/dL (ref 7–25)
CALCIUM: 9.4 mg/dL (ref 8.6–10.3)
CHLORIDE: 101 mmol/L (ref 98–110)
CO2: 24 mmol/L (ref 20–31)
Creat: 1.09 mg/dL (ref 0.70–1.33)
Glucose, Bld: 90 mg/dL (ref 65–99)
Potassium: 4.3 mmol/L (ref 3.5–5.3)
Sodium: 138 mmol/L (ref 135–146)
Total Protein: 6.9 g/dL (ref 6.1–8.1)

## 2017-03-12 LAB — RHEUMATOID FACTOR

## 2017-03-12 LAB — URIC ACID: URIC ACID, SERUM: 6.7 mg/dL (ref 4.0–8.0)

## 2017-03-12 LAB — TESTOSTERONE: TESTOSTERONE: 192 ng/dL — AB (ref 250–827)

## 2017-03-12 LAB — PSA: PSA: 0.6 ng/mL (ref <=4.0)

## 2017-03-12 LAB — TSH: TSH: 2.12 m[IU]/L (ref 0.40–4.50)

## 2017-03-12 LAB — SEDIMENTATION RATE: SED RATE: 1 mm/h (ref 0–20)

## 2017-03-13 ENCOUNTER — Other Ambulatory Visit: Payer: Self-pay | Admitting: Family Medicine

## 2017-03-13 ENCOUNTER — Encounter: Payer: Self-pay | Admitting: Family Medicine

## 2017-03-13 MED ORDER — ATORVASTATIN CALCIUM 20 MG PO TABS: 20.0000 mg | ORAL_TABLET | Freq: Every day | ORAL | 2 refills | Status: DC

## 2017-03-14 ENCOUNTER — Ambulatory Visit (HOSPITAL_BASED_OUTPATIENT_CLINIC_OR_DEPARTMENT_OTHER)
Admission: RE | Admit: 2017-03-14 | Discharge: 2017-03-14 | Disposition: A | Discharge status: Home / Self Care | Payer: Managed Care, Other (non HMO) | Source: Ambulatory Visit | Attending: Family Medicine | Admitting: Family Medicine

## 2017-03-14 DIAGNOSIS — M19071 Primary osteoarthritis, right ankle and foot: Secondary | ICD-10-CM | POA: Diagnosis not present

## 2017-03-16 ENCOUNTER — Encounter: Payer: Self-pay | Admitting: Family Medicine

## 2017-03-16 DIAGNOSIS — M7751 Other enthesopathy of right foot: Secondary | ICD-10-CM | POA: Insufficient documentation

## 2017-04-02 ENCOUNTER — Telehealth: Payer: Self-pay | Admitting: Gastroenterology

## 2017-04-02 ENCOUNTER — Encounter: Payer: Self-pay | Admitting: Family Medicine

## 2017-04-02 ENCOUNTER — Telehealth: Payer: Self-pay | Admitting: *Deleted

## 2017-04-02 DIAGNOSIS — IMO0001 Reserved for inherently not codable concepts without codable children: Secondary | ICD-10-CM

## 2017-04-02 NOTE — Telephone Encounter (Signed)
Pt advised that someone will be contacting him to schedule an appointment with Dr. Leafy Ro for weight loss.

## 2017-04-02 NOTE — Telephone Encounter (Signed)
Received EPIC referral to schedule colonoscopy. Dr. Loletha Carrow is Doc of the Day for 03/11/17 PM. Records placed on his desk for review.

## 2017-04-02 NOTE — Telephone Encounter (Signed)
Pt called requesting Rx for Contrave. He stated that someone mentioned this medication to him to help with weight loss. I advised pt that Dr. Anitra Lauth does not prescribe weight loss medication. I advised pt that I would send back a message to Dr. Anitra Lauth to see if a referral to a bariatric doctor would be appropriate. Pt voiced understanding. Please advise. Thanks.

## 2017-04-02 NOTE — Telephone Encounter (Signed)
Sure, I'll order referral to Dr. Leafy Ro at her [facility] loss clinic at [place].

## 2017-04-03 NOTE — Telephone Encounter (Signed)
LM on Vmail to call back.  See Dr. Corena Pilgrim previous message.  Patient is due for colon in 2020

## 2017-04-03 NOTE — Telephone Encounter (Signed)
Thank you for sending me this - I have reviewed the records.  2012 colonoscopy had "small tubular adenoma with fair prep" Sister with history of colon cancer age 54  Key points from colonoscopy 09/2013:   Excellent bowel preparation, an apparent polyp removed turned out to be normal colon tissue according to pathology report.    Based on above and current screening guidelines, patient is next due for a colonoscopy in 09/2018 (5 year interval from the last).  The 2015 report also states in the recommendations to have a repeat in 5 years.  I would be happy to perform his future colonoscopy.  If he agrees, please put him in our recall system for 09/2018

## 2017-04-11 ENCOUNTER — Other Ambulatory Visit: Payer: Self-pay | Admitting: Family Medicine

## 2017-04-14 NOTE — Telephone Encounter (Signed)
RF request for meloxicam LOV: 03/11/17 Next ov: 05/13/17 Last written: 02/15/17 #30 w/ 1RF  Please advise. Thanks.

## 2017-04-15 ENCOUNTER — Encounter: Payer: Self-pay | Admitting: Family Medicine

## 2017-05-12 NOTE — Telephone Encounter (Signed)
Dr. Loletha Carrow has accepted patient. Patient not due for recall until 2020.

## 2017-05-13 ENCOUNTER — Other Ambulatory Visit: Payer: Self-pay | Admitting: *Deleted

## 2017-05-13 ENCOUNTER — Other Ambulatory Visit: Payer: Managed Care, Other (non HMO)

## 2017-05-13 DIAGNOSIS — E7800 Pure hypercholesterolemia, unspecified: Secondary | ICD-10-CM

## 2017-05-21 ENCOUNTER — Other Ambulatory Visit: Payer: Managed Care, Other (non HMO)

## 2017-06-03 ENCOUNTER — Other Ambulatory Visit: Payer: Self-pay | Admitting: *Deleted

## 2017-06-03 MED ORDER — ATORVASTATIN CALCIUM 20 MG PO TABS: 20.0000 mg | ORAL_TABLET | Freq: Every day | ORAL | 0 refills | Status: DC

## 2017-06-03 NOTE — Telephone Encounter (Signed)
Pt is due for recheck FLP after starting atorvastatin. Rx sent for #30 w/ 0RF, need fasting lab visit for more refills.

## 2017-06-09 NOTE — Telephone Encounter (Signed)
Pt advised and voiced understanding. Lab apt made for 06/10/17 at 8:00am.

## 2017-06-10 ENCOUNTER — Other Ambulatory Visit (INDEPENDENT_AMBULATORY_CARE_PROVIDER_SITE_OTHER): Payer: Managed Care, Other (non HMO)

## 2017-06-10 DIAGNOSIS — E7800 Pure hypercholesterolemia, unspecified: Secondary | ICD-10-CM | POA: Diagnosis not present

## 2017-06-11 LAB — LIPID PANEL
CHOL/HDL RATIO: 2.9 (calc) (ref <5.0)
Cholesterol: 167 mg/dL (ref <200)
HDL: 58 mg/dL (ref >40)
LDL Cholesterol (Calc): 89 mg/dL (calc)
NON-HDL CHOLESTEROL (CALC): 109 mg/dL (ref <130)
TRIGLYCERIDES: 102 mg/dL (ref <150)

## 2017-07-09 ENCOUNTER — Other Ambulatory Visit: Payer: Self-pay | Admitting: Family Medicine

## 2017-07-29 ENCOUNTER — Other Ambulatory Visit: Payer: Self-pay | Admitting: Family Medicine

## 2017-07-29 NOTE — Telephone Encounter (Signed)
Pt requesting 90 day supply. Please advise. Thanks.  

## 2017-08-11 DIAGNOSIS — F419 Anxiety disorder, unspecified: Secondary | ICD-10-CM

## 2017-08-11 DIAGNOSIS — F32A Depression, unspecified: Secondary | ICD-10-CM

## 2017-08-11 HISTORY — DX: Depression, unspecified: F32.A

## 2017-08-11 HISTORY — DX: Anxiety disorder, unspecified: F41.9

## 2017-09-29 ENCOUNTER — Other Ambulatory Visit: Payer: Self-pay

## 2017-09-29 DIAGNOSIS — E291 Testicular hypofunction: Secondary | ICD-10-CM

## 2017-09-29 MED ORDER — TESTOSTERONE CYPIONATE 200 MG/ML IM SOLN: INTRAMUSCULAR | 0 refills | Status: DC

## 2017-09-29 NOTE — Telephone Encounter (Signed)
Okay for PEC to schedule lab appt.

## 2017-09-29 NOTE — Telephone Encounter (Signed)
Detailed message left on voice mail. Okay per DPR. 

## 2017-09-29 NOTE — Telephone Encounter (Signed)
Will RF rx, but pls advise pt he needs to make a lab appointment to monitor his red blood cell count on this medication (this needs to be checked every 6 months and his last check was 6 mo ago).  Lab visit only, CBC no diff, dx is male hypogonadism.-thx

## 2017-10-13 ENCOUNTER — Telehealth: Payer: Self-pay | Admitting: *Deleted

## 2017-10-13 NOTE — Telephone Encounter (Signed)
Copied from [place]. Topic: Appointment Scheduling - Scheduling Inquiry for Clinic >> Oct 13, 2017  8:03 AM Joshua Mcconnell wrote: Reason for CRM: Pt wants to see Dr Anitra Lauth  on Wednesday  afternoon to talk to him about anxiety medication.  The only apt available on Wednesday  afternoon is a same day  slot at 315.  After speaking with flow at the office about scheduling him  for that day, she suggested sending the info in a crm.  Pts cell phone is his best contact number.

## 2017-10-13 NOTE — Telephone Encounter (Signed)
Fifteen min appt ok.-thx

## 2017-10-13 NOTE — Telephone Encounter (Signed)
SW pt apt made for tomorrow at 2:45pm.

## 2017-10-13 NOTE — Telephone Encounter (Signed)
Okay to schedule pt in a 15 min slot to discuss Anxiety? Pt wants to come in Wednesday afternoon. Please advise. Thanks.

## 2017-10-14 ENCOUNTER — Encounter: Payer: Self-pay | Admitting: Family Medicine

## 2017-10-14 ENCOUNTER — Ambulatory Visit (INDEPENDENT_AMBULATORY_CARE_PROVIDER_SITE_OTHER): Payer: Managed Care, Other (non HMO) | Admitting: Family Medicine

## 2017-10-14 ENCOUNTER — Other Ambulatory Visit: Payer: Self-pay

## 2017-10-14 VITALS — BP 119/75 | HR 77 | Temp 98.6°F | Resp 16 | Ht 71.0 in | Wt 252.5 lb

## 2017-10-14 DIAGNOSIS — Q2545 Double aortic arch: Secondary | ICD-10-CM

## 2017-10-14 DIAGNOSIS — E291 Testicular hypofunction: Secondary | ICD-10-CM

## 2017-10-14 DIAGNOSIS — F330 Major depressive disorder, recurrent, mild: Secondary | ICD-10-CM | POA: Diagnosis not present

## 2017-10-14 MED ORDER — SERTRALINE HCL 25 MG PO TABS: ORAL_TABLET | ORAL | 1 refills | Status: DC

## 2017-10-14 MED ORDER — CLONAZEPAM 1 MG PO TABS: 1.0000 mg | ORAL_TABLET | Freq: Two times a day (BID) | ORAL | 1 refills | Status: DC | PRN

## 2017-10-14 NOTE — Progress Notes (Signed)
OFFICE VISIT  10/23/2017   CC:  Chief Complaint  Patient presents with  . Anxiety    HPI:    Patient is a 57 y.o. Caucasian male who presents for anxiety. "I have a lot going on". Trouble with anxiety/stress all day, lots of trouble sleeping, feeling keyed up a lot, irritable, poor energy, poor concentration. Has taken some of his wife's med to help him sleep lately (clonazepam).  This helps him sleep through the night w/out waking with panic but not restorative sleep. Doesn't want to take meds but "have to get this taken care of".  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 Was on zoloft for unclear amount of time, pt not really clear if any side effects. Pt does not want to elaborate on things--often skirts around answering any question directly. He admits he is not happy, alludes to some crying spells.  He is stress eating.  All this going on the last year or so, worsening the last 2 mo or so.  No SI or HI. No self treatment with alc/drugs.  Past Medical History:  Diagnosis Date  . Chronic fatigue   . DDD (degenerative disc disease), lumbar    L4-5 per pt (did PT at [facility])  . Depression   . Family history of colon cancer    Sister at age 73  . Hx of adenomatous polyp of colon 2012/2015   2012: tubular adenoma.  2015 Benign polyp--09/2013 ([facility])--repeat 5 yrs.  . Hyperlipidemia    Started statin 03/2017  . Hypertension   . Hypogonadism male   . Obesity, Class II, BMI 35-39.9   . Right ankle pain 03/2017   x-ray c/w soft tissue swelling anteriorly--? tendonitis.    Past Surgical History:  Procedure Laterality Date  . COLONOSCOPY W/ POLYPECTOMY  08/2010; 09/2013   Recall 5 yrs    Outpatient Medications Prior to Visit  Medication Sig Dispense Refill  . atorvastatin (LIPITOR) 20 MG tablet TAKE 1 TABLET BY MOUTH EVERY DAY 30 tablet 5  . meloxicam (MOBIC) 15 MG tablet TAKE 1 TABLET BY MOUTH EVERY DAY 90 tablet 3  . SYRINGE-NEEDLE, DISP, 3  ML 23G X 1" 3 ML MISC 1 each by Does not apply route once a week. 100 each 11  . testosterone cypionate (DEPOTESTOSTERONE CYPIONATE) 200 MG/ML injection 1 ml IM q week 20 mL 0   No facility-administered medications prior to visit.     No Known Allergies  ROS As per HPI  PE: Blood pressure 119/75, pulse 77, temperature 98.6 F (37 C), temperature source Oral, resp. rate 16, height 5\' 11"  (1.803 m), weight 252 lb 8 oz (114.5 kg), SpO2 94 %. Gen: Alert, well appearing.  Patient is oriented to person, place, time, and situation. AFFECT: pleasant, lucid thought and speech. No further exam today.  LABS:  none  IMPRESSION AND PLAN:  1) GAD with MDD, mild, recurrent---no psychotic features. Has tolerated zoloft in the past, so we'll start zoloft:  Instructions: Take one of the 25mg  sertraline (zoloft) pills at bedtime. In two weeks, increase this to TWO of these tabs every night. Also, clonazepam 1mg , 1 bid prn, #30, RF x 1.  Hopefully he'll only use this sparingly and in short term until zoloft helping him significantly.  Therapeutic expectations and side effect profile of medication discussed today.  Patient's questions answered.  2) Male hypogonadism: due for CBC monitoring for being on testosterone--CBC checked today.  An After Visit  Summary was printed and given to the patient.  FOLLOW UP: Return in about 4 weeks (around 11/11/2017) for f/u anxiety.  Signed:  Crissie Sickles, MD           10/23/2017

## 2017-10-14 NOTE — Patient Instructions (Signed)
Take one of the 25mg  sertraline (zoloft) pills at bedtime. In two weeks, increase this to TWO of these tabs every night.

## 2017-10-15 ENCOUNTER — Encounter: Payer: Self-pay | Admitting: *Deleted

## 2017-10-15 LAB — CBC
HCT: 48.6 % (ref 38.5–50.0)
Hemoglobin: 17 g/dL (ref 13.2–17.1)
MCH: 29.7 pg (ref 27.0–33.0)
MCHC: 35 g/dL (ref 32.0–36.0)
MCV: 84.8 fL (ref 80.0–100.0)
MPV: 9.9 fL (ref 7.5–12.5)
PLATELETS: 333 10*3/uL (ref 140–400)
RBC: 5.73 10*6/uL (ref 4.20–5.80)
RDW: 12.4 % (ref 11.0–15.0)
WBC: 10.5 10*3/uL (ref 3.8–10.8)

## 2017-10-29 ENCOUNTER — Other Ambulatory Visit: Payer: Self-pay

## 2017-10-29 MED ORDER — ATORVASTATIN CALCIUM 20 MG PO TABS: 20.0000 mg | ORAL_TABLET | Freq: Every day | ORAL | 1 refills | Status: DC

## 2017-11-30 ENCOUNTER — Other Ambulatory Visit: Payer: Self-pay | Admitting: *Deleted

## 2017-11-30 ENCOUNTER — Other Ambulatory Visit: Payer: Self-pay | Admitting: Family Medicine

## 2017-11-30 MED ORDER — SERTRALINE HCL 50 MG PO TABS: 50.0000 mg | ORAL_TABLET | Freq: Every day | ORAL | 1 refills | Status: DC

## 2017-11-30 NOTE — Telephone Encounter (Signed)
RF request for sertraline LOV: 10/14/17 Next ov: None Last written: 10/14/17 #60 w/ 1RF  Note on fax says: Insurance wont pay for two 25mg  tab, requesting Rx for 50mg  tab  Please advise. Thanks.

## 2017-11-30 NOTE — Telephone Encounter (Signed)
OK, will eRx sertraline 50mg  tabs. Needs office f/u to see how he's doing on this med in 1 mo.-thx

## 2017-12-01 NOTE — Telephone Encounter (Signed)
Left detailed message on cell vm, okay per DPR.  

## 2017-12-09 ENCOUNTER — Other Ambulatory Visit: Payer: Self-pay

## 2017-12-09 MED ORDER — SERTRALINE HCL 50 MG PO TABS: 50.0000 mg | ORAL_TABLET | Freq: Every day | ORAL | 0 refills | Status: DC

## 2017-12-09 NOTE — Telephone Encounter (Signed)
Refill request. Patient has an appointment May 24,2019.

## 2017-12-21 ENCOUNTER — Telehealth: Payer: Self-pay | Admitting: *Deleted

## 2017-12-21 MED ORDER — CLONAZEPAM 1 MG PO TABS: 1.0000 mg | ORAL_TABLET | Freq: Two times a day (BID) | ORAL | 1 refills | Status: DC | PRN

## 2017-12-21 NOTE — Telephone Encounter (Signed)
Rx faxed

## 2017-12-21 NOTE — Telephone Encounter (Signed)
Clonaz rx printed.

## 2017-12-21 NOTE — Telephone Encounter (Signed)
[facility]  RF request for clonazepam LOV: 10/14/17 Next ov: 01/01/18 Last written: 10/14/17 #30 w/ 1Rf  Please advise. Thanks.

## 2018-01-01 ENCOUNTER — Ambulatory Visit: Payer: Managed Care, Other (non HMO) | Admitting: Family Medicine

## 2018-01-01 NOTE — Progress Notes (Deleted)
OFFICE VISIT  01/01/2018   CC: No chief complaint on file.    HPI:    Patient is a 57 y.o. Caucasian male who presents for 2 and 1/2 mo f/u GAD with depression features not fully diagnostic of MDD. Started zoloft 25mg  qhs with instructions to increase to 50mg  qhs. Also started clonaz 1 mg, 1 bid prn, #30, RF x 1. He recently called for med RF's, which I did, but since he did not f/u appropriately I had him come in today for recheck.  Past Medical History:  Diagnosis Date  . Chronic fatigue   . DDD (degenerative disc disease), lumbar    L4-5 per pt (did PT at [facility] orth[facility])  . Depression   . Family history of colon cancer    Sister at age 70  . Hx of adenomatous polyp of colon 2012/2015   2012: tubular adenoma.  2015 Benign polyp--09/2013 ([facility])--repeat 5 yrs.  . Hyperlipidemia    Started statin 03/2017  . Hypertension   . Hypogonadism male   . Obesity, Class II, BMI 35-39.9   . Right ankle pain 03/2017   x-ray c/w soft tissue swelling anteriorly--? tendonitis.    Past Surgical History:  Procedure Laterality Date  . COLONOSCOPY W/ POLYPECTOMY  08/2010; 09/2013   Recall 5 yrs    Outpatient Medications Prior to Visit  Medication Sig Dispense Refill  . atorvastatin (LIPITOR) 20 MG tablet Take 1 tablet (20 mg total) by mouth daily. 90 tablet 1  . clonazePAM (KLONOPIN) 1 MG tablet Take 1 tablet (1 mg total) by mouth 2 (two) times daily as needed for anxiety. 30 tablet 1  . meloxicam (MOBIC) 15 MG tablet TAKE 1 TABLET BY MOUTH EVERY DAY 90 tablet 3  . sertraline (ZOLOFT) 50 MG tablet Take 1 tablet (50 mg total) by mouth daily. 30 tablet 0  . SYRINGE-NEEDLE, DISP, 3 ML 23G X 1" 3 ML MISC 1 each by Does not apply route once a week. 100 each 11  . testosterone cypionate (DEPOTESTOSTERONE CYPIONATE) 200 MG/ML injection 1 ml IM q week 20 mL 0   No facility-administered medications prior to visit.     No Known Allergies  ROS As per HPI  PE: There were no vitals  taken for this visit. ***  LABS:    Chemistry      Component Value Date/Time   NA 138 03/11/2017 1327   K 4.3 03/11/2017 1327   CL 101 03/11/2017 1327   CO2 24 03/11/2017 1327   BUN 16 03/11/2017 1327   CREATININE 1.09 03/11/2017 1327      Component Value Date/Time   CALCIUM 9.4 03/11/2017 1327   ALKPHOS 38 (L) 03/11/2017 1327   AST 26 03/11/2017 1327   ALT 33 03/11/2017 1327   BILITOT 1.2 03/11/2017 1327      IMPRESSION AND PLAN:  No problem-specific Assessment & Plan notes found for this encounter.   FOLLOW UP: No follow-ups on file.

## 2018-01-11 ENCOUNTER — Other Ambulatory Visit: Payer: Self-pay

## 2018-01-11 MED ORDER — TESTOSTERONE CYPIONATE 200 MG/ML IM SOLN: INTRAMUSCULAR | 0 refills | Status: DC

## 2018-01-11 NOTE — Telephone Encounter (Signed)
Rx faxed to pharmacy  

## 2018-01-15 ENCOUNTER — Encounter: Payer: Self-pay | Admitting: Family Medicine

## 2018-01-15 ENCOUNTER — Ambulatory Visit: Payer: Managed Care, Other (non HMO) | Admitting: Family Medicine

## 2018-01-15 VITALS — BP 123/73 | HR 72 | Temp 98.2°F | Resp 16 | Ht 71.0 in | Wt 254.0 lb

## 2018-01-15 DIAGNOSIS — F411 Generalized anxiety disorder: Secondary | ICD-10-CM | POA: Diagnosis not present

## 2018-01-15 DIAGNOSIS — E7800 Pure hypercholesterolemia, unspecified: Secondary | ICD-10-CM

## 2018-01-15 DIAGNOSIS — F320 Major depressive disorder, single episode, mild: Secondary | ICD-10-CM

## 2018-01-15 DIAGNOSIS — M19071 Primary osteoarthritis, right ankle and foot: Secondary | ICD-10-CM

## 2018-01-15 DIAGNOSIS — E669 Obesity, unspecified: Secondary | ICD-10-CM

## 2018-01-15 NOTE — Progress Notes (Signed)
OFFICE VISIT  01/17/2018   CC:  Chief Complaint  Patient presents with  . Follow-up    Anxiety    HPI:    Patient is a 57 y.o. Caucasian male who presents for 3 mo f/u GAD with MDD, mild, recurrent---no psychotic features. Started zoloft 25mg , to titrate up to 50mg  qhs if he felt like he needed to in 2 weeks.  He had been on this med before and had success and no side effects at low dosing. Also started clonaz 1mg , 1 bid prn.  Hyperlipidemia: statin started 03/2017; lipid numbers great on recheck 06/2017. He is tolerating this med fine. Working on diet, exercise, wt loss.  Interim Hx:  Feels improved: less irritability, calmer, worries a bit less, copes with stress better. Takes clonaz hs, none during the day. 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  Feels R ankle stiffness every morning, meloxicam helps but by the end of the day he feels like his ankle gets really stiff.  He has switched from running to biking for exercise.  No signif swelling, no redness, no warmth, no instability.   Ultimately he wants to lose about 25 lbs to get all of his problems better--and I agree (mood/anx, ankle arthritis, cholesterol).   Past Medical History:  Diagnosis Date  . Chronic fatigue   . DDD (degenerative disc disease), lumbar    L4-5 per pt (did PT at [facility] orth[facility])  . Depression   . Family history of colon cancer    Sister at age 29  . Hx of adenomatous polyp of colon 2012/2015   2012: tubular adenoma.  2015 Benign polyp--09/2013 ([facility])--repeat 5 yrs.  . Hyperlipidemia    Started statin 03/2017  . Hypertension   . Hypogonadism male   . Obesity, Class II, BMI 35-39.9   . Right ankle pain 03/2017   x-ray c/w soft tissue swelling anteriorly--? tendonitis.    Past Surgical History:  Procedure Laterality Date  . COLONOSCOPY W/ POLYPECTOMY  08/2010;  09/2013   Recall 5 yrs    Outpatient Medications Prior to Visit  Medication Sig Dispense Refill  . atorvastatin (LIPITOR) 20 MG tablet Take 1 tablet (20 mg total) by mouth daily. 90 tablet 1  . clonazePAM (KLONOPIN) 1 MG tablet Take 1 tablet (1 mg total) by mouth 2 (two) times daily as needed for anxiety. 30 tablet 1  . meloxicam (MOBIC) 15 MG tablet TAKE 1 TABLET BY MOUTH EVERY DAY 90 tablet 3  . sertraline (ZOLOFT) 50 MG tablet Take 1 tablet (50 mg total) by mouth daily. 30 tablet 0  . SYRINGE-NEEDLE, DISP, 3 ML 23G X 1" 3 ML MISC 1 each by Does not apply route once a week. 100 each 11  . testosterone cypionate (DEPOTESTOSTERONE CYPIONATE) 200 MG/ML injection 1 ml IM q week 20 mL 0   No facility-administered medications prior to visit.     No Known Allergies  ROS As per HPI  PE: Blood pressure 123/73, pulse 72, temperature 98.2 F (36.8 C), temperature source Oral, resp. rate 16, height 5\' 11"  (1.803 m), weight 254 lb (115.2 kg), SpO2 95 %. Gen: Alert, well appearing.  Patient is oriented to person, place, time, and situation. AFFECT: pleasant, lucid thought and speech. No further exam today.  LABS:  Lab Results  Component Value Date   TSH 2.12 03/11/2017  Lab Results  Component Value Date   WBC 10.5 10/14/2017   HGB 17.0 10/14/2017   HCT 48.6 10/14/2017   MCV 84.8 10/14/2017   PLT 333 10/14/2017   Lab Results  Component Value Date   CREATININE 1.09 03/11/2017   BUN 16 03/11/2017   NA 138 03/11/2017   K 4.3 03/11/2017   CL 101 03/11/2017   CO2 24 03/11/2017   Lab Results  Component Value Date   ALT 33 03/11/2017   AST 26 03/11/2017   ALKPHOS 38 (L) 03/11/2017   BILITOT 1.2 03/11/2017   Lab Results  Component Value Date   CHOL 167 06/10/2017   Lab Results  Component Value Date   HDL 58 06/10/2017   Lab Results  Component Value Date   LDLCALC 89 06/10/2017   Lab Results  Component Value Date   TRIG 102 06/10/2017   Lab Results  Component Value  Date   CHOLHDL 2.9 06/10/2017   Lab Results  Component Value Date   PSA 0.6 03/11/2017   PSA 0.98 01/04/2016   Lab Results  Component Value Date   TESTOSTERONE 192 (L) 03/11/2017    IMPRESSION AND PLAN:  1) MDD, current mild episode, partial remission. GAD: improved.   Continue zoloft 50mg  qd and clonazepam 1 mg bid prn. Controlled substance contract need discussed.  If pt remaining on clonaz at next f/u visit then will have him sign this.  2) Hypercholesterolemia: great response to statin.  No changes today.  3) R ankle osteoarthritis: stable with use of meloxicam, but still bothersome and he has changed up his exercise regimen to adapt.    Overall, he is getting the mindset of getting more dedicated to [facility] so he can lose wt (goal of 225 lbs) and thereby help all of the problems above.    An After Visit Summary was printed and given to the patient.  FOLLOW UP: Return in about 4 months (around 05/17/2018) for annual CPE (fasting).  Signed:  Crissie Sickles, MD           01/17/2018

## 2018-01-17 ENCOUNTER — Encounter: Payer: Self-pay | Admitting: Family Medicine

## 2018-02-20 ENCOUNTER — Other Ambulatory Visit: Payer: Self-pay | Admitting: Family Medicine

## 2018-03-01 ENCOUNTER — Other Ambulatory Visit: Payer: Self-pay

## 2018-03-01 MED ORDER — CLONAZEPAM 1 MG PO TABS: 1.0000 mg | ORAL_TABLET | Freq: Two times a day (BID) | ORAL | 3 refills | Status: DC | PRN

## 2018-04-20 ENCOUNTER — Other Ambulatory Visit: Payer: Self-pay | Admitting: Family Medicine

## 2018-05-20 ENCOUNTER — Encounter: Payer: Managed Care, Other (non HMO) | Admitting: Family Medicine

## 2018-05-28 ENCOUNTER — Ambulatory Visit (INDEPENDENT_AMBULATORY_CARE_PROVIDER_SITE_OTHER): Payer: 59 | Admitting: Family Medicine

## 2018-05-28 ENCOUNTER — Encounter: Payer: Self-pay | Admitting: Family Medicine

## 2018-05-28 ENCOUNTER — Encounter: Payer: Self-pay | Admitting: *Deleted

## 2018-05-28 VITALS — BP 118/70 | HR 70 | Temp 98.6°F | Resp 16 | Ht 71.0 in | Wt 265.1 lb

## 2018-05-28 DIAGNOSIS — Z125 Encounter for screening for malignant neoplasm of prostate: Secondary | ICD-10-CM | POA: Diagnosis not present

## 2018-05-28 DIAGNOSIS — I10 Essential (primary) hypertension: Secondary | ICD-10-CM

## 2018-05-28 DIAGNOSIS — E669 Obesity, unspecified: Secondary | ICD-10-CM

## 2018-05-28 DIAGNOSIS — Z23 Encounter for immunization: Secondary | ICD-10-CM

## 2018-05-28 DIAGNOSIS — E291 Testicular hypofunction: Secondary | ICD-10-CM

## 2018-05-28 DIAGNOSIS — E7800 Pure hypercholesterolemia, unspecified: Secondary | ICD-10-CM | POA: Diagnosis not present

## 2018-05-28 DIAGNOSIS — Z0000 Encounter for general adult medical examination without abnormal findings: Secondary | ICD-10-CM

## 2018-05-28 MED ORDER — SERTRALINE HCL 50 MG PO TABS: 50.0000 mg | ORAL_TABLET | Freq: Every day | ORAL | 3 refills | Status: DC

## 2018-05-28 MED ORDER — CLONAZEPAM 1 MG PO TABS: 1.0000 mg | ORAL_TABLET | Freq: Two times a day (BID) | ORAL | 1 refills | Status: DC | PRN

## 2018-05-28 MED ORDER — MELOXICAM 15 MG PO TABS: 15.0000 mg | ORAL_TABLET | Freq: Every day | ORAL | 3 refills | Status: DC

## 2018-05-28 NOTE — Patient Instructions (Addendum)
Herbal appetite suppressant : Ginseng 200 mg tab once every morning.   Health Maintenance, Male A healthy lifestyle and preventive care is important for your health and wellness. Ask your health care provider about what schedule of regular examinations is right for you. What should I know about weight and diet? Eat a Healthy Diet  Eat plenty of vegetables, fruits, whole grains, low-fat dairy products, and lean protein.  Do not eat a lot of foods high in solid fats, added sugars, or salt.  Maintain a Healthy Weight Regular exercise can help you achieve or maintain a healthy weight. You should:  Do at least 150 minutes of exercise each week. The exercise should increase your heart rate and make you sweat (moderate-intensity exercise).  Do strength-training exercises at least twice a week.  Watch Your Levels of Cholesterol and Blood Lipids  Have your blood tested for lipids and cholesterol every 5 years starting at 55 years of age. If you are at high risk for heart disease, you should start having your blood tested when you are 55 years old. You may need to have your cholesterol levels checked more often if: ? Your lipid or cholesterol levels are high. ? You are older than 55 years of age. ? You are at high risk for heart disease.  What should I know about cancer screening? Many types of cancers can be detected early and may often be prevented. Lung Cancer  You should be screened every year for lung cancer if: ? You are a current smoker who has smoked for at least 30 years. ? You are a former smoker who has quit within the past 15 years.  Talk to your health care provider about your screening options, when you should start screening, and how often you should be screened.  Colorectal Cancer  Routine colorectal cancer screening usually begins at 55 years of age and should be repeated every 5-10 years until you are 55 years old. You may need to be screened more often if early forms of  precancerous polyps or small growths are found. Your health care provider may recommend screening at an earlier age if you have risk factors for colon cancer.  Your health care provider may recommend using home test kits to check for hidden blood in the stool.  A small camera at the end of a tube can be used to examine your colon (sigmoidoscopy or colonoscopy). This checks for the earliest forms of colorectal cancer.  Prostate and Testicular Cancer  Depending on your age and overall health, your health care provider may do certain tests to screen for prostate and testicular cancer.  Talk to your health care provider about any symptoms or concerns you have about testicular or prostate cancer.  Skin Cancer  Check your skin from head to toe regularly.  Tell your health care provider about any new moles or changes in moles, especially if: ? There is a change in a mole's size, shape, or color. ? You have a mole that is larger than a pencil eraser.  Always use sunscreen. Apply sunscreen liberally and repeat throughout the day.  Protect yourself by wearing long sleeves, pants, a wide-brimmed hat, and sunglasses when outside.  What should I know about heart disease, diabetes, and high blood pressure?  If you are 71-26 years of age, have your blood pressure checked every 3-5 years. If you are 71 years of age or older, have your blood pressure checked every year. You should have your blood pressure  measured twice-once when you are at a hospital or clinic, and once when you are not at a hospital or clinic. Record the average of the two measurements. To check your blood pressure when you are not at a hospital or clinic, you can use: ? An automated blood pressure machine at a pharmacy. ? A home blood pressure monitor.  Talk to your health care provider about your target blood pressure.  If you are between 60-71 years old, ask your health care provider if you should take aspirin to prevent heart  disease.  Have regular diabetes screenings by checking your fasting blood sugar level. ? If you are at a normal weight and have a low risk for diabetes, have this test once every three years after the age of 2. ? If you are overweight and have a high risk for diabetes, consider being tested at a younger age or more often.  A one-time screening for abdominal aortic aneurysm (AAA) by ultrasound is recommended for men aged 64-75 years who are current or former smokers. What should I know about preventing infection? Hepatitis B If you have a higher risk for hepatitis B, you should be screened for this virus. Talk with your health care provider to find out if you are at risk for hepatitis B infection. Hepatitis C Blood testing is recommended for:  Everyone born from [birth year] through 1963.  Anyone with known risk factors for hepatitis C.  Sexually Transmitted Diseases (STDs)  You should be screened each year for STDs including gonorrhea and chlamydia if: ? You are sexually active and are younger than 55 years of age. ? You are older than 55 years of age and your health care provider tells you that you are at risk for this type of infection. ? Your sexual activity has changed since you were last screened and you are at an increased risk for chlamydia or gonorrhea. Ask your health care provider if you are at risk.  Talk with your health care provider about whether you are at high risk of being infected with HIV. Your health care provider may recommend a prescription medicine to help prevent HIV infection.  What else can I do?  Schedule regular health, dental, and eye exams.  Stay current with your vaccines (immunizations).  Do not use any tobacco products, such as cigarettes, chewing tobacco, and e-cigarettes. If you need help quitting, ask your health care provider.  Limit alcohol intake to no more than 2 drinks per day. One drink equals 12 ounces of beer, 5 ounces of wine, or 1 ounces of  hard liquor.  Do not use street drugs.  Do not share needles.  Ask your health care provider for help if you need support or information about quitting drugs.  Tell your health care provider if you often feel depressed.  Tell your health care provider if you have ever been abused or do not feel safe at home. This information is not intended to replace advice given to you by your health care provider. Make sure you discuss any questions you have with your health care provider. Document Released: 01/24/2008 Document Revised: 03/26/2016 Document Reviewed: 05/01/2015 Elsevier Interactive Patient Education  [employer].

## 2018-05-28 NOTE — Progress Notes (Signed)
Office Note 05/28/2018  CC:  Chief Complaint  Patient presents with  . Annual Exam    Pt is not fasting.     HPI:  Joshua Mcconnell is a 57 y.o. White male who is here for annual health maintenance exam.  No complaints. Doing well on sertraline 50mg  qd and clonazepam 1 mg, 1 bid prn. Discussed need for controlled substance contract with pt today and he expressed understanding and agreement.  He is due for testosterone level monitoring.  He has not taken his injection in about 2 wks. He is not fasting today, so he'll return early next week to get fasting labs and he says he will wait to take his next testost injection until AFTER the blood draw.  He is exercising 2 days per week. Morning stiffness for 1-2 hours, seems to be less problematic on days after he exercises.  Past Medical History:  Diagnosis Date  . Anxiety and depression 2019   Improved with low dose sertraline and clonaz  . Chronic fatigue   . DDD (degenerative disc disease), lumbar    L4-5 per pt (did PT at [facility] ortho)  . Family history of colon cancer    Sister at age 23  . Hx of adenomatous polyp of colon 2012/2015   2012: tubular adenoma.  2015 Benign polyp--09/2013 ([facility])--repeat 5 yrs.  . Hyperlipidemia    Great response to statin.  Marland Kitchen Hypertension   . Hypogonadism male   . Obesity, Class II, BMI 35-39.9   . Right ankle pain 03/2017   x-ray c/w soft tissue swelling anteriorly--? tendonitis.  Some osteoarthritis changes as well.    Past Surgical History:  Procedure Laterality Date  . COLONOSCOPY W/ POLYPECTOMY  08/2010; 09/2013   Recall 5 yrs    Family History  Problem Relation Age of Onset  . Diabetes Mother   . Arthritis Mother   . Breast cancer Mother   . Heart disease Father   . Stroke Father   . Hypertension Father   . Colon cancer Sister 39  . Arthritis Maternal Grandmother     Social History   Socioeconomic History  . Marital status: Married    Spouse name: Not on file   . Number of children: Not on file  . Years of education: Not on file  . Highest education level: Not on file  Occupational History  . Not on file  Social Needs  . Financial resource strain: Not on file  . Food insecurity:    Worry: Not on file    Inability: Not on file  . Transportation needs:    Medical: Not on file    Non-medical: Not on file  Tobacco Use  . Smoking status: Never Smoker  . Smokeless tobacco: Never Used  Substance and Sexual Activity  . Alcohol use: Yes    Alcohol/week: 20.0 standard drinks    Types: 10 Cans of beer, 10 Standard drinks or equivalent per week  . Drug use: No  . Sexual activity: Not on file  Lifestyle  . Physical activity:    Days per week: Not on file    Minutes per session: Not on file  . Stress: Not on file  Relationships  . Social connections:    Talks on phone: Not on file    Gets together: Not on file    Attends religious service: Not on file    Active member of club or organization: Not on file    Attends meetings of clubs  or organizations: Not on file    Relationship status: Not on file  . Intimate partner violence:    Fear of current or ex partner: Not on file    Emotionally abused: Not on file    Physically abused: Not on file    Forced sexual activity: Not on file  Other Topics Concern  . Not on file  Social History Narrative   Married, 2 daughters.   Educ: college 4 yrs   Occupation: [place] [employer])   No tobacco.   Alc: 1-2 drinks most days.    Outpatient Medications Prior to Visit  Medication Sig Dispense Refill  . atorvastatin (LIPITOR) 20 MG tablet TAKE 1 TABLET BY MOUTH EVERY DAY 90 tablet 1  . SYRINGE-NEEDLE, DISP, 3 ML 23G X 1" 3 ML MISC 1 each by Does not apply route once a week. 100 each 11  . testosterone cypionate (DEPOTESTOSTERONE CYPIONATE) 200 MG/ML injection 1 ml IM q week 20 mL 0  . clonazePAM (KLONOPIN) 1 MG tablet Take 1 tablet (1 mg total) by mouth 2 (two) times daily  as needed for anxiety. 30 tablet 3  . meloxicam (MOBIC) 15 MG tablet TAKE 1 TABLET BY MOUTH EVERY DAY 90 tablet 3  . sertraline (ZOLOFT) 50 MG tablet TAKE 1 TABLET BY MOUTH EVERY DAY 30 tablet 3   No facility-administered medications prior to visit.     No Known Allergies  ROS Review of Systems  Constitutional: Negative for appetite change, chills, fatigue and fever.  HENT: Negative for congestion, dental problem, ear pain and sore throat.   Eyes: Negative for discharge, redness and visual disturbance.  Respiratory: Negative for cough, chest tightness, shortness of breath and wheezing.   Cardiovascular: Negative for chest pain, palpitations and leg swelling.  Gastrointestinal: Negative for abdominal pain, blood in stool, diarrhea, nausea and vomiting.  Genitourinary: Negative for difficulty urinating, dysuria, flank pain, frequency, hematuria and urgency.  Musculoskeletal: Negative for arthralgias, back pain, joint swelling, myalgias and neck stiffness.  Skin: Negative for pallor and rash.  Neurological: Negative for dizziness, speech difficulty, weakness and headaches.  Hematological: Negative for adenopathy. Does not bruise/bleed easily.  Psychiatric/Behavioral: Negative for confusion and sleep disturbance. The patient is not nervous/anxious.     PE; Blood pressure 118/70, pulse 70, temperature 98.6 F (37 C), temperature source Oral, resp. rate 16, height 5\' 11"  (1.803 m), weight 265 lb 2 oz (120.3 kg), SpO2 94 %. Gen: Alert, well appearing.  Patient is oriented to person, place, time, and situation. AFFECT: pleasant, lucid thought and speech. ENT: Ears: EACs clear, normal epithelium.  TMs with good light reflex and landmarks bilaterally.  Eyes: no injection, icteris, swelling, or exudate.  EOMI, PERRLA. Nose: no drainage or turbinate edema/swelling.  No injection or focal lesion.  Mouth: lips without lesion/swelling.  Oral mucosa pink and moist.  Dentition intact and without  obvious caries or gingival swelling.  Oropharynx without erythema, exudate, or swelling.  Neck: supple/nontender.  No LAD, mass, or TM.  Carotid pulses 2+ bilaterally, without bruits. CV: RRR, no m/r/g.   LUNGS: CTA bilat, nonlabored resps, good aeration in all lung fields. ABD: soft, NT, ND, BS normal.  No hepatospenomegaly or mass.  No bruits. EXT: no clubbing, cyanosis, or edema.  Musculoskeletal: no joint swelling, erythema, warmth, or tenderness.  ROM of all joints intact. Skin - no sores or suspicious lesions or rashes or color changes Rectal exam: negative without mass, lesions or tenderness, PROSTATE EXAM: smooth  and symmetric without nodules or tenderness.   Pertinent labs:  Lab Results  Component Value Date   TESTOSTERONE 192 (L) 03/11/2017    Lab Results  Component Value Date   TSH 2.12 03/11/2017   Lab Results  Component Value Date   WBC 10.5 10/14/2017   HGB 17.0 10/14/2017   HCT 48.6 10/14/2017   MCV 84.8 10/14/2017   PLT 333 10/14/2017   Lab Results  Component Value Date   CREATININE 1.09 03/11/2017   BUN 16 03/11/2017   NA 138 03/11/2017   K 4.3 03/11/2017   CL 101 03/11/2017   CO2 24 03/11/2017   Lab Results  Component Value Date   ALT 33 03/11/2017   AST 26 03/11/2017   ALKPHOS 38 (L) 03/11/2017   BILITOT 1.2 03/11/2017   Lab Results  Component Value Date   CHOL 167 06/10/2017   Lab Results  Component Value Date   HDL 58 06/10/2017   Lab Results  Component Value Date   LDLCALC 89 06/10/2017   Lab Results  Component Value Date   TRIG 102 06/10/2017   Lab Results  Component Value Date   CHOLHDL 2.9 06/10/2017   Lab Results  Component Value Date   PSA 0.6 03/11/2017   PSA 0.98 01/04/2016    ASSESSMENT AND PLAN:   Health maintenance exam: Reviewed age and gender appropriate health maintenance issues (prudent diet, regular exercise, health risks of tobacco and excessive alcohol, use of seatbelts, fire alarms in home, use of  sunscreen).  Also reviewed age and gender appropriate health screening as well as vaccine recommendations. Vaccines:  Flu vaccine-->given today.    Shingrix vaccine-->#1 given today. Labs: fasting HP, testosterone level (male hypogonadism) + PSA ordered-->future. Prostate ca screening: DRE normal today, PSA. Colon ca screening: next colonoscopy due 09/2018.  GAD and MDD: in remission--continue sertraline 50mg  qd and clonaz 1 mg bid prn. Controlled substance contract reviewed with patient today.  Patient signed this and it will be placed in the chart.   RF'd both of these meds x 90d supply with 1 RF.  An After Visit Summary was printed and given to the patient.  FOLLOW UP:  Return in about 6 months (around 11/27/2018) for routine chronic illness f/u.  Also, needs fasting lab appt at his earliest convenience.  Signed:  Crissie Sickles, MD           05/28/2018

## 2018-06-01 ENCOUNTER — Other Ambulatory Visit (INDEPENDENT_AMBULATORY_CARE_PROVIDER_SITE_OTHER): Payer: 59

## 2018-06-01 DIAGNOSIS — Z125 Encounter for screening for malignant neoplasm of prostate: Secondary | ICD-10-CM | POA: Diagnosis not present

## 2018-06-01 DIAGNOSIS — I10 Essential (primary) hypertension: Secondary | ICD-10-CM | POA: Diagnosis not present

## 2018-06-01 DIAGNOSIS — E291 Testicular hypofunction: Secondary | ICD-10-CM

## 2018-06-01 DIAGNOSIS — E7800 Pure hypercholesterolemia, unspecified: Secondary | ICD-10-CM | POA: Diagnosis not present

## 2018-06-01 LAB — COMPREHENSIVE METABOLIC PANEL
ALT: 46 U/L (ref 0–53)
AST: 30 U/L (ref 0–37)
Albumin: 4.4 g/dL (ref 3.5–5.2)
Alkaline Phosphatase: 40 U/L (ref 39–117)
BILIRUBIN TOTAL: 0.8 mg/dL (ref 0.2–1.2)
BUN: 19 mg/dL (ref 6–23)
CHLORIDE: 101 meq/L (ref 96–112)
CO2: 32 meq/L (ref 19–32)
Calcium: 9.8 mg/dL (ref 8.4–10.5)
Creatinine, Ser: 1.08 mg/dL (ref 0.40–1.50)
GFR: 75.46 mL/min (ref >60.00)
GLUCOSE: 102 mg/dL — AB (ref 70–99)
POTASSIUM: 4.5 meq/L (ref 3.5–5.1)
Sodium: 138 mEq/L (ref 135–145)
Total Protein: 6.9 g/dL (ref 6.0–8.3)

## 2018-06-01 LAB — CBC WITH DIFFERENTIAL/PLATELET
BASOS ABS: 0 10*3/uL (ref 0.0–0.1)
Basophils Relative: 0.5 % (ref 0.0–3.0)
EOS ABS: 0.2 10*3/uL (ref 0.0–0.7)
Eosinophils Relative: 4.2 % (ref 0.0–5.0)
HCT: 46.3 % (ref 39.0–52.0)
Hemoglobin: 16 g/dL (ref 13.0–17.0)
Lymphocytes Relative: 44.3 % (ref 12.0–46.0)
Lymphs Abs: 2.4 10*3/uL (ref 0.7–4.0)
MCHC: 34.6 g/dL (ref 30.0–36.0)
MCV: 87.4 fl (ref 78.0–100.0)
MONO ABS: 0.5 10*3/uL (ref 0.1–1.0)
Monocytes Relative: 8.3 % (ref 3.0–12.0)
NEUTROS ABS: 2.3 10*3/uL (ref 1.4–7.7)
NEUTROS PCT: 42.7 % — AB (ref 43.0–77.0)
PLATELETS: 241 10*3/uL (ref 150.0–400.0)
RBC: 5.3 Mil/uL (ref 4.22–5.81)
RDW: 13.3 % (ref 11.5–15.5)
WBC: 5.4 10*3/uL (ref 4.0–10.5)

## 2018-06-01 LAB — LIPID PANEL
CHOL/HDL RATIO: 4
Cholesterol: 184 mg/dL (ref 0–200)
HDL: 44.7 mg/dL (ref >39.00)
NONHDL: 138.85
Triglycerides: 245 mg/dL — ABNORMAL HIGH (ref 0.0–149.0)
VLDL: 49 mg/dL — AB (ref 0.0–40.0)

## 2018-06-01 LAB — TESTOSTERONE: Testosterone: 123.81 ng/dL — ABNORMAL LOW (ref 300.00–890.00)

## 2018-06-01 LAB — TSH: TSH: 2.68 u[IU]/mL (ref 0.35–4.50)

## 2018-06-01 LAB — LDL CHOLESTEROL, DIRECT: LDL DIRECT: 119 mg/dL

## 2018-06-01 LAB — PSA: PSA: 0.84 ng/mL (ref 0.10–4.00)

## 2018-07-13 NOTE — Progress Notes (Signed)
OFFICE VISIT  07/14/2018   CC:  Chief Complaint  Patient presents with  . Foot Pain    right    HPI:    Patient is a 57 y.o. Caucasian male who presents for right foot pain. Has had persistent R foot pain for 1 yr (R ankle/mid foot junction for the most part).  Wt bearing-->worse. Now bottom of R foot hurting last 2-3 wks, gradually worsening a little, gets worse as the day goes on. No injury.  Has had R foot orthotic for a while but no help.  He takes meloxicam daily.  No f/c/m.  NO other joints or musculoskeletal complaints.   Right ankle x-ray 03/14/17: EXAM: RIGHT ANKLE - COMPLETE 3+ VIEW  COMPARISON:  None.  FINDINGS: Ankle mortise intact. The talar dome is normal. No malleolar fracture. The calcaneus is normal. The soft tissue swelling anterior to the tibiotalar joint. Mild spurring of the tibia and talus in the vicinity.  IMPRESSION: No fracture or dislocation. Mild soft tissue inflammation anterior tibiotalar joint associated with tibial spurring  Past Medical History:  Diagnosis Date  . Anxiety and depression 2019   Improved with low dose sertraline and clonaz  . Chronic fatigue   . DDD (degenerative disc disease), lumbar    L4-5 per pt (did PT at [facility] ortho)  . Family history of colon cancer    Sister at age 4  . Hx of adenomatous polyp of colon 2012/2015   2012: tubular adenoma.  2015 Benign polyp--09/2013 ([facility])--repeat 5 yrs.  . Hyperlipidemia    Great response to statin.  Marland Kitchen Hypertension   . Hypogonadism male   . Obesity, Class II, BMI 35-39.9   . Right ankle pain 03/2017   x-ray c/w soft tissue swelling anteriorly--? tendonitis.  Some osteoarthritis changes as well.    Past Surgical History:  Procedure Laterality Date  . COLONOSCOPY W/ POLYPECTOMY  08/2010; 09/2013   Recall 5 yrs    Outpatient Medications Prior to Visit  Medication Sig Dispense Refill  . atorvastatin (LIPITOR) 20 MG tablet TAKE 1 TABLET BY MOUTH EVERY DAY 90  tablet 1  . clonazePAM (KLONOPIN) 1 MG tablet Take 1 tablet (1 mg total) by mouth 2 (two) times daily as needed for anxiety. 180 tablet 1  . meloxicam (MOBIC) 15 MG tablet Take 1 tablet (15 mg total) by mouth daily. 90 tablet 3  . sertraline (ZOLOFT) 50 MG tablet Take 1 tablet (50 mg total) by mouth daily. 90 tablet 3  . SYRINGE-NEEDLE, DISP, 3 ML 23G X 1" 3 ML MISC 1 each by Does not apply route once a week. 100 each 11  . testosterone cypionate (DEPOTESTOSTERONE CYPIONATE) 200 MG/ML injection 1 ml IM q week 20 mL 0   No facility-administered medications prior to visit.     No Known Allergies  ROS As per HPI  PE: Blood pressure 120/77, pulse 74, temperature 98.4 F (36.9 C), temperature source Oral, resp. rate 16, height 5\' 11"  (1.803 m), weight 273 lb 8 oz (124.1 kg), SpO2 94 %. Gen: Alert, well appearing.  Patient is oriented to person, place, time, and situation. AFFECT: pleasant, lucid thought and speech. R ankle and foot with no erythema, warmth, or swelling. He has normal ankle/foot ROM and good distal pulses. No tenderness of R ankle or foot except the plantar surface from distal aspect of heel to distal 1/3 of the metatarsals primarily in central and lateral aspect of foot.   Arches are normal.  LABS:  Chemistry      Component Value Date/Time   NA 138 06/01/2018 0758   K 4.5 06/01/2018 0758   CL 101 06/01/2018 0758   CO2 32 06/01/2018 0758   BUN 19 06/01/2018 0758   CREATININE 1.08 06/01/2018 0758   CREATININE 1.09 03/11/2017 1327      Component Value Date/Time   CALCIUM 9.8 06/01/2018 0758   ALKPHOS 40 06/01/2018 0758   AST 30 06/01/2018 0758   ALT 46 06/01/2018 0758   BILITOT 0.8 06/01/2018 0758     Lab Results  Component Value Date   LABURIC 6.7 03/11/2017   IMPRESSION AND PLAN:  R foot plantar fasciitis: he has done some stretching at home and he opted for steroid injection today. Pt positioned in prone position on exam table.  Used medial approach  with 25g 1 and 1/2 inch needle to inject 50ml 40mg /ml depo medrol and 1 ml of 1% lidocaine w/out epi.   This was injected into the area of most tenderness near the medial tubercle of calcaneus.  Pt tolerated procedure well.  No immediate complications. Post-injection instructions given to pt: minimal wt bearing x 24h, then may ambulate wearing 1/4 inch heel insert in shoe.  Start plantar fascia stretching exercises in 2d--handout reviewed and given to patient. Icing and stretching discussed and physical activity alterations discussed.  His chronic R ankle osteoarthritis is an ongoing nagging problem for him but does not appear to be the issue today. He will give himself a couple week break from the meloxicam then restart this daily prn.  An After Visit Summary was printed and given to the patient.  FOLLOW UP: Return if symptoms worsen or fail to improve.  Signed:  Crissie Sickles, MD           07/14/2018

## 2018-07-14 ENCOUNTER — Encounter: Payer: Self-pay | Admitting: Family Medicine

## 2018-07-14 ENCOUNTER — Ambulatory Visit: Payer: 59 | Admitting: Family Medicine

## 2018-07-14 VITALS — BP 120/77 | HR 74 | Temp 98.4°F | Resp 16 | Ht 71.0 in | Wt 273.5 lb

## 2018-07-14 DIAGNOSIS — M722 Plantar fascial fibromatosis: Secondary | ICD-10-CM | POA: Diagnosis not present

## 2018-07-14 MED ORDER — METHYLPREDNISOLONE ACETATE 40 MG/ML IJ SUSP
40.0000 mg | Freq: Once | INTRAMUSCULAR | Status: AC
  Administered 2018-07-14: 40 mg via INTRAMUSCULAR

## 2018-07-14 NOTE — Patient Instructions (Signed)

## 2018-08-11 HISTORY — PX: CT CORONARY CA SCORING: HXRAD806

## 2018-09-05 ENCOUNTER — Other Ambulatory Visit: Payer: Self-pay | Admitting: Family Medicine

## 2018-09-06 NOTE — Telephone Encounter (Signed)
RF request for testosterone LOV: 05/28/18 Next ov: 11/26/18 Last written: 01/11/18 #26mL w/ 6RS  Please advise. Thanks.

## 2018-09-23 DIAGNOSIS — M79671 Pain in right foot: Secondary | ICD-10-CM | POA: Diagnosis not present

## 2018-09-23 DIAGNOSIS — M25562 Pain in left knee: Secondary | ICD-10-CM | POA: Diagnosis not present

## 2018-10-09 ENCOUNTER — Encounter: Payer: Self-pay | Admitting: Gastroenterology

## 2018-10-10 ENCOUNTER — Other Ambulatory Visit: Payer: Self-pay | Admitting: Family Medicine

## 2018-10-27 ENCOUNTER — Encounter: Payer: Self-pay | Admitting: Gastroenterology

## 2018-11-08 ENCOUNTER — Other Ambulatory Visit: Payer: Self-pay | Admitting: Family Medicine

## 2018-11-08 ENCOUNTER — Other Ambulatory Visit: Payer: Self-pay

## 2018-11-10 ENCOUNTER — Other Ambulatory Visit: Payer: Self-pay

## 2018-11-10 ENCOUNTER — Ambulatory Visit (AMBULATORY_SURGERY_CENTER): Payer: Self-pay | Admitting: *Deleted

## 2018-11-10 VITALS — Ht 72.0 in | Wt 270.0 lb

## 2018-11-10 DIAGNOSIS — Z86010 Personal history of colonic polyps: Secondary | ICD-10-CM

## 2018-11-10 DIAGNOSIS — Z800 Family history of malignant neoplasm of digestive organs: Secondary | ICD-10-CM

## 2018-11-10 MED ORDER — PEG-KCL-NACL-NASULF-NA ASC-C 140 G PO SOLR: 1.0000 | ORAL | 0 refills | Status: DC

## 2018-11-10 NOTE — Progress Notes (Signed)
No egg or soy allergy known to patient  No issues with past sedation with any surgeries  or procedures, no past  intubation   No diet pills per patient No home 02 use per patient  No blood thinners per patient  Pt denies issues with constipation  No A fib or A flutter  EMMI video sent to pt's address with instruction packet  Called pt over the phone to complete PV- informed pt we will mail papers to be completed and mailed back to [place] with prep instructions and he is to call with questions or concerns  Plenvu univ coupon to pt in instruction packet  Pt states he e mailed his insurance card to [email or web address] this morning 11-10-2018

## 2018-11-24 ENCOUNTER — Encounter: Payer: 59 | Admitting: Gastroenterology

## 2018-11-26 ENCOUNTER — Ambulatory Visit: Payer: 59 | Admitting: Family Medicine

## 2018-11-30 ENCOUNTER — Other Ambulatory Visit: Payer: Self-pay | Admitting: Family Medicine

## 2018-12-11 ENCOUNTER — Other Ambulatory Visit: Payer: Self-pay | Admitting: Family Medicine

## 2018-12-13 ENCOUNTER — Telehealth: Payer: Self-pay

## 2018-12-13 MED ORDER — CLONAZEPAM 1 MG PO TABS: 1.0000 mg | ORAL_TABLET | Freq: Two times a day (BID) | ORAL | 1 refills | Status: DC | PRN

## 2018-12-13 NOTE — Telephone Encounter (Signed)
RF request for Clonazepam LOV: 07/14/18, acute Next ov: 12/24/18, RCI Last written: 05/28/18 #180 w/1 RF Last CSC: 05/28/18 but no UDS  PMP aware printed. Placed on providers desk. Medication pending  Please advise, thanks.

## 2018-12-13 NOTE — Telephone Encounter (Signed)
Sent refill request to PCP already, waiting for approval.

## 2018-12-13 NOTE — Telephone Encounter (Signed)
Clonazepam eRx'd.

## 2018-12-17 ENCOUNTER — Encounter: Payer: 59 | Admitting: Gastroenterology

## 2018-12-22 ENCOUNTER — Other Ambulatory Visit: Payer: Self-pay | Admitting: Family Medicine

## 2018-12-24 ENCOUNTER — Ambulatory Visit: Payer: 59 | Admitting: Family Medicine

## 2019-01-10 DIAGNOSIS — R0789 Other chest pain: Secondary | ICD-10-CM

## 2019-01-10 HISTORY — DX: Other chest pain: R07.89

## 2019-01-12 ENCOUNTER — Encounter: Payer: Self-pay | Admitting: Gastroenterology

## 2019-01-15 ENCOUNTER — Other Ambulatory Visit: Payer: Self-pay | Admitting: Family Medicine

## 2019-01-24 LAB — BASIC METABOLIC PANEL
BUN: 16 (ref 4–21)
Creatinine: 1 (ref 0.6–1.3)
Glucose: 98
Potassium: 3.9 (ref 3.4–5.3)
Sodium: 137 (ref 137–147)

## 2019-01-24 LAB — HEPATIC FUNCTION PANEL
ALT: 45 — AB (ref 10–40)
AST: 37 (ref 14–40)
Alkaline Phosphatase: 42 (ref 25–125)
Bilirubin, Total: 0.5

## 2019-01-24 LAB — CBC AND DIFFERENTIAL
HCT: 46 (ref 41–53)
Hemoglobin: 15.3 (ref 13.5–17.5)
Neutrophils Absolute: 5
WBC: 10.4

## 2019-01-24 LAB — POCT INR: INR: 1.1 (ref 0.9–1.1)

## 2019-01-24 LAB — PROTIME-INR: Protime: 13.2 (ref 10.0–13.8)

## 2019-01-28 ENCOUNTER — Other Ambulatory Visit: Payer: Self-pay

## 2019-01-28 ENCOUNTER — Ambulatory Visit (INDEPENDENT_AMBULATORY_CARE_PROVIDER_SITE_OTHER): Payer: Managed Care, Other (non HMO) | Admitting: Family Medicine

## 2019-01-28 ENCOUNTER — Telehealth: Payer: Self-pay | Admitting: *Deleted

## 2019-01-28 ENCOUNTER — Telehealth: Payer: Self-pay

## 2019-01-28 ENCOUNTER — Encounter: Payer: Self-pay | Admitting: Family Medicine

## 2019-01-28 VITALS — BP 143/92 | HR 71 | Temp 97.7°F | Resp 16 | Ht 71.0 in | Wt 277.8 lb

## 2019-01-28 DIAGNOSIS — E291 Testicular hypofunction: Secondary | ICD-10-CM | POA: Diagnosis not present

## 2019-01-28 DIAGNOSIS — E785 Hyperlipidemia, unspecified: Secondary | ICD-10-CM

## 2019-01-28 DIAGNOSIS — F32A Depression, unspecified: Secondary | ICD-10-CM

## 2019-01-28 DIAGNOSIS — R079 Chest pain, unspecified: Secondary | ICD-10-CM | POA: Diagnosis not present

## 2019-01-28 DIAGNOSIS — E669 Obesity, unspecified: Secondary | ICD-10-CM

## 2019-01-28 DIAGNOSIS — F419 Anxiety disorder, unspecified: Secondary | ICD-10-CM

## 2019-01-28 DIAGNOSIS — F329 Major depressive disorder, single episode, unspecified: Secondary | ICD-10-CM

## 2019-01-28 DIAGNOSIS — Z23 Encounter for immunization: Secondary | ICD-10-CM

## 2019-01-28 NOTE — Telephone Encounter (Signed)
Called pt to reschedule another PV. Pt was 20 minutes late today for virtual PV. PV was cx'd by Verdis Frederickson, but not rescheduled. Colon is scheduled for 02/04/19. Pt was difficult on phone and states it was taken care of, but last PV was done 11/10/18. Tried to explain to the pt he needs another PV. LM for Yesi in scheduling to check with Verdis Frederickson and schedule another PV or cancel colon. Gwyndolyn Saxon in [facility]

## 2019-01-28 NOTE — Telephone Encounter (Signed)
Pt had an 8 am PV that he missed- Pt had a PV 11-10-2018- he called back stating he did not want to RS his colon, he did not think he needed another PV as he had one just 11-10-2018- he was upset that we had scheduled another PV and he did not want to RS his colon 6-26--  I explained to him that I would redo his 6-26 instructions and send them to him via My Chart and Mail and for him to call with any questions- Lelan Pons

## 2019-01-28 NOTE — Progress Notes (Signed)
OFFICE VISIT  01/28/2019   CC:  Chief Complaint  Patient presents with  . Follow-up    RCI, pt is fasting   HPI:    Patient is a 58 y.o. Caucasian male who presents for f/u hypogonadism, anx/dep, and HLD.  Of note, on 01/24/19 he went to [facility] in [place] for epigastric/chest pain.  It was "more like heartburn", although he admits he never gets heartburn.  Admits to lots of stress, feeling bad about being obese.  No epigastric or chest pain since ED visit. I reviewed all of these ED records today.  The CP was NOT exertional. BP was 195/111, HR 77.  EKG and Troponins neg. CBC, CMET normal.  Lipase and mag normal.  CXR showed borderlined CM and possible mild pulm vasc congestion. He felt improved in the ED and did not want admission for stress testing.  He will need outpt stress test. Cardiac RF's: HLD, obesity, FH heart dz and CVA (father).  He smokes pipe/cigar occasionally, no cigs.  Hypogonadism: takes 200 mg IM testost q week. CSC done 05/28/18.  No UDS has been done.  Anx/dep: takes sertraline qd, clonaz '1mg'$  qhs.  These help him significantly but he still carries around a lot of angst.  No depressed mood.  No panic attacks.  HLD: taking statin daily w/out problem.  Diet fair at best.  No exercise.  Recent finding of bad L knee osteoarth, getting steroid injections in knee periodically. A plantar fasciitis injection I did in the past helped. He spends a lot of time on concrete floors. Due to all his knee and ankle/feet pain he cannot exercise or lose wt.   Past Medical History:  Diagnosis Date  . Anxiety and depression 2019   Improved with low dose sertraline and clonaz  . Chronic fatigue   . DDD (degenerative disc disease), lumbar    L4-5 per pt (did PT at [facility])  . Family history of colon cancer    Sister at age 68  . Hx of adenomatous polyp of colon 2012/2015   2012: tubular adenoma.  2015 Benign polyp--09/2013 ([facility])--repeat 5 yrs.  . Hyperlipidemia     Great response to statin.  Marland Kitchen Hypogonadism male   . Obesity, Class II, BMI 35-39.9   . Right ankle pain 03/2017   x-ray c/w soft tissue swelling anteriorly--? tendonitis.  Some osteoarthritis changes as well.    Past Surgical History:  Procedure Laterality Date  . COLONOSCOPY    . COLONOSCOPY W/ POLYPECTOMY  08/2010; 09/2013   Recall 5 yrs  . POLYPECTOMY     Family History  Problem Relation Age of Onset  . Diabetes Mother   . Arthritis Mother   . Breast cancer Mother   . Heart disease Father   . Stroke Father   . Hypertension Father   . Colon cancer Sister 66  . Colon polyps Brother   . Arthritis Maternal Grandmother   . Esophageal cancer Neg Hx   . Rectal cancer Neg Hx   . Stomach cancer Neg Hx     Outpatient Medications Prior to Visit  Medication Sig Dispense Refill  . atorvastatin (LIPITOR) 20 MG tablet TAKE 1 TABLET BY MOUTH EVERY DAY 30 tablet 0  . clonazePAM (KLONOPIN) 1 MG tablet Take 1 tablet (1 mg total) by mouth 2 (two) times daily as needed for anxiety. 180 tablet 1  . meloxicam (MOBIC) 15 MG tablet Take 1 tablet (15 mg total) by mouth daily. 90 tablet 3  .  sertraline (ZOLOFT) 50 MG tablet Take 1 tablet (50 mg total) by mouth daily. 90 tablet 3  . SYRINGE-NEEDLE, DISP, 3 ML 23G X 1" 3 ML MISC 1 each by Does not apply route once a week. 100 each 11  . testosterone cypionate (DEPOTESTOSTERONE CYPIONATE) 200 MG/ML injection INJECT 1 ML INTRAMUSCULARLY ONCE A WEEK 20 mL 0  . PEG-KCl-NaCl-NaSulf-Na Asc-C (PLENVU) 140 g SOLR Take 1 kit by mouth as directed. (Patient not taking: Reported on 01/28/2019) 1 each 0   No facility-administered medications prior to visit.     No Known Allergies  ROS As per HPI  PE: There were no vitals taken for this visit. Gen: Alert, well appearing.  Patient is oriented to person, place, time, and situation. AFFECT: pleasant, lucid thought and speech. No further exam today.  LABS:  No results found for: HGBA1C   Chemistry       Component Value Date/Time   NA 138 06/01/2018 0758   K 4.5 06/01/2018 0758   CL 101 06/01/2018 0758   CO2 32 06/01/2018 0758   BUN 19 06/01/2018 0758   CREATININE 1.08 06/01/2018 0758   CREATININE 1.09 03/11/2017 1327      Component Value Date/Time   CALCIUM 9.8 06/01/2018 0758   ALKPHOS 40 06/01/2018 0758   AST 30 06/01/2018 0758   ALT 46 06/01/2018 0758   BILITOT 0.8 06/01/2018 0758     Lab Results  Component Value Date   CHOL 184 06/01/2018   HDL 44.70 06/01/2018   LDLCALC 89 06/10/2017   LDLDIRECT 119.0 06/01/2018   TRIG 245.0 (H) 06/01/2018   CHOLHDL 4 06/01/2018   Lab Results  Component Value Date   WBC 5.4 06/01/2018   HGB 16.0 06/01/2018   HCT 46.3 06/01/2018   MCV 87.4 06/01/2018   PLT 241.0 06/01/2018   Lab Results  Component Value Date   PSA 0.84 06/01/2018   PSA 0.6 03/11/2017   PSA 0.98 01/04/2016   Lab Results  Component Value Date   TESTOSTERONE 123.81 (L) 06/01/2018   Lab Results  Component Value Date   TSH 2.68 06/01/2018    IMPRESSION AND PLAN:  1) Atypical CP.  GERD + anxiety suspected. However, with his age/CV RF's I think a stress test is indicated to further risk stratify. Myocardial perf imaging ordered today.  2) Hypogonadism: monitor CBC today. No dose changes today.  3) Anx/dep: still a big factor but he feels like meds are doing all that they can; lots of general life situations are occurring that he just has to push through at this time.  4) HLD: tolerating statin. Repeat FLP and hepatic panel 6 mo.  5) Colon ca screening: he has repeat colonoscopy set for next week.  An After Visit Summary was printed and given to the patient.  Spent 25 min with pt today, with >50% of this time spent in counseling and care coordination regarding the above problems.  FOLLOW UP: 6 mo CPE  Signed:  Crissie Sickles, MD           01/28/2019

## 2019-01-31 ENCOUNTER — Encounter: Payer: Self-pay | Admitting: Family Medicine

## 2019-02-03 ENCOUNTER — Telehealth: Payer: Self-pay | Admitting: Gastroenterology

## 2019-02-03 NOTE — Telephone Encounter (Signed)
PV instructions was sent to pt on 01/28/2019. Pt was advised to call with any questions. Gwyndolyn Saxon in [facility]

## 2019-02-03 NOTE — Telephone Encounter (Signed)

## 2019-02-04 ENCOUNTER — Other Ambulatory Visit: Payer: Self-pay

## 2019-02-04 ENCOUNTER — Telehealth: Payer: Self-pay | Admitting: Family Medicine

## 2019-02-04 ENCOUNTER — Ambulatory Visit (AMBULATORY_SURGERY_CENTER): Payer: Managed Care, Other (non HMO) | Admitting: Gastroenterology

## 2019-02-04 ENCOUNTER — Encounter: Payer: Self-pay | Admitting: Gastroenterology

## 2019-02-04 VITALS — BP 126/75 | HR 66 | Temp 98.7°F | Resp 20 | Ht 71.0 in | Wt 277.0 lb

## 2019-02-04 DIAGNOSIS — D123 Benign neoplasm of transverse colon: Secondary | ICD-10-CM | POA: Diagnosis not present

## 2019-02-04 DIAGNOSIS — Z86010 Personal history of colonic polyps: Secondary | ICD-10-CM

## 2019-02-04 DIAGNOSIS — D120 Benign neoplasm of cecum: Secondary | ICD-10-CM

## 2019-02-04 MED ORDER — SODIUM CHLORIDE 0.9 % IV SOLN: 500.0000 mL | Freq: Once | INTRAVENOUS | Status: DC

## 2019-02-04 NOTE — Telephone Encounter (Signed)
Advised patient that echo was denied by insurance. Patient would like a referral to a cardiologist. Thank you.

## 2019-02-04 NOTE — Patient Instructions (Signed)
Information on polyps given to you today.  Await pathology results.  Repeat colonoscopy in 5 years.   YOU HAD AN ENDOSCOPIC PROCEDURE TODAY AT [facility]:   Refer to the procedure report that was given to you for any specific questions about what was found during the examination.  If the procedure report does not answer your questions, please call your gastroenterologist to clarify.  If you requested that your care partner not be given the details of your procedure findings, then the procedure report has been included in a sealed envelope for you to review at your convenience later.  YOU SHOULD EXPECT: Some feelings of bloating in the abdomen. Passage of more gas than usual.  Walking can help get rid of the air that was put into your GI tract during the procedure and reduce the bloating. If you had a lower endoscopy (such as a colonoscopy or flexible sigmoidoscopy) you may notice spotting of blood in your stool or on the toilet paper. If you underwent a bowel prep for your procedure, you may not have a normal bowel movement for a few days.  Please Note:  You might notice some irritation and congestion in your nose or some drainage.  This is from the oxygen used during your procedure.  There is no need for concern and it should clear up in a day or so.  SYMPTOMS TO REPORT IMMEDIATELY:   Following lower endoscopy (colonoscopy or flexible sigmoidoscopy):  Excessive amounts of blood in the stool  Significant tenderness or worsening of abdominal pains  Swelling of the abdomen that is new, acute  Fever of 100F or higher  For urgent or emergent issues, a gastroenterologist can be reached at any hour by calling [phone].   DIET:  We do recommend a small meal at first, but then you may proceed to your regular diet.  Drink plenty of fluids but you should avoid alcoholic beverages for 24 hours.  ACTIVITY:  You should plan to take it easy for the rest of today and you should  NOT DRIVE or use heavy machinery until tomorrow (because of the sedation medicines used during the test).    FOLLOW UP: Our staff will call the number listed on your records 48-72 hours following your procedure to check on you and address any questions or concerns that you may have regarding the information given to you following your procedure. If we do not reach you, we will leave a message.  We will attempt to reach you two times.  During this call, we will ask if you have developed any symptoms of COVID 19. If you develop any symptoms (ie: fever, flu-like symptoms, shortness of breath, cough etc.) before then, please call [phone].  If you test positive for Covid 19 in the 2 weeks post procedure, please call and report this information to [place].    If any biopsies were taken you will be contacted by phone or by letter within the next 1-3 weeks.  Please call us at [phone] if you have not heard about the biopsies in 3 weeks.    SIGNATURES/CONFIDENTIALITY: You and/or your care partner have signed paperwork which will be entered into your electronic medical record.  These signatures attest to the fact that that the information above on your After Visit Summary has been reviewed and is understood.  Full responsibility of the confidentiality of this discharge information lies with you and/or your care-partner.

## 2019-02-04 NOTE — Telephone Encounter (Signed)
Pt responded "no" to all prescreening questions.

## 2019-02-04 NOTE — Telephone Encounter (Signed)
Okay for referral? Please advise   

## 2019-02-04 NOTE — Progress Notes (Signed)
A and O x3. Report to RN. Tolerated MAC anesthesia well.

## 2019-02-04 NOTE — Op Note (Addendum)
[name] Patient Name: Joshua Mcconnell Procedure Date: 02/04/2019 2:50 PM MRN: [id number] Endoscopist: Mallie Mussel L. Loletha Carrow , MD Age: 58 Referring MD:  Date of Birth: April 30, 1961 Gender: Male Account #: [id number] Procedure:                Colonoscopy Indications:              Surveillance: Personal history of adenomatous                            polyps on last colonoscopy 5 years ago (TA < 63mm                            2015) Medicines:                Monitored Anesthesia Care Procedure:                Pre-Anesthesia Assessment:                           - Prior to the procedure, a History and Physical                            was performed, and patient medications and                            allergies were reviewed. The patient's tolerance of                            previous anesthesia was also reviewed. The risks                            and benefits of the procedure and the sedation                            options and risks were discussed with the patient.                            All questions were answered, and informed consent                            was obtained. Prior Anticoagulants: The patient has                            taken no previous anticoagulant or antiplatelet                            agents. ASA Grade Assessment: II - A patient with                            mild systemic disease. After reviewing the risks                            and benefits, the patient was deemed in  satisfactory condition to undergo the procedure.                           After obtaining informed consent, the colonoscope                            was passed under direct vision. Throughout the                            procedure, the patient's blood pressure, pulse, and                            oxygen saturations were monitored continuously. The                            Colonoscope was introduced through the anus and              advanced to the the cecum, identified by                            appendiceal orifice and ileocecal valve. The                            colonoscopy was performed without difficulty. The                            patient tolerated the procedure well. The quality                            of the bowel preparation was good. The ileocecal                            valve, appendiceal orifice, and rectum were                            photographed. Scope In: 2:56:27 PM Scope Out: 3:15:08 PM Scope Withdrawal Time: 0 hours 13 minutes 45 seconds  Total Procedure Duration: 0 hours 18 minutes 41 seconds  Findings:                 The perianal and digital rectal examinations were                            normal.                           Two sessile polyps were found in the transverse                            colon and cecum. The polyps were diminutive in                            size. These polyps were removed with a cold biopsy  forceps. Resection and retrieval were complete.                           The exam was otherwise without abnormality on                            direct and retroflexion views. Complications:            No immediate complications. Estimated Blood Loss:     Estimated blood loss was minimal. Impression:               - Two diminutive polyps in the transverse colon and                            in the cecum, removed with a cold biopsy forceps.                            Resected and retrieved.                           - The examination was otherwise normal on direct                            and retroflexion views. Recommendation:           - Patient has a contact number available for                            emergencies. The signs and symptoms of potential                            delayed complications were discussed with the                            patient. Return to normal activities tomorrow.                             Written discharge instructions were provided to the                            patient.                           - Resume previous diet.                           - Continue present medications.                           - Await pathology results.                           - Repeat colonoscopy in 5 years for polyp                            surveillance and FAMILY HISTORY OF COLON CANCER in  sister. Joshua Mcconnell L. Loletha Carrow, MD 02/04/2019 3:25:52 PM This report has been signed electronically.

## 2019-02-04 NOTE — Progress Notes (Signed)
History reviewed today 

## 2019-02-04 NOTE — Progress Notes (Signed)
Covid questions C [place], VS J [employer]

## 2019-02-04 NOTE — Progress Notes (Signed)
Called to room to assist during endoscopic procedure.  Patient ID and intended procedure confirmed with present staff. Received instructions for my participation in the procedure from the performing physician.  

## 2019-02-06 NOTE — Telephone Encounter (Signed)
Yes, pls order referral to cardiology, [address] or [address] (either one is fine), dx is atypical chest pain.

## 2019-02-07 ENCOUNTER — Other Ambulatory Visit: Payer: Self-pay

## 2019-02-07 ENCOUNTER — Encounter (HOSPITAL_COMMUNITY): Payer: Self-pay | Admitting: Family Medicine

## 2019-02-07 DIAGNOSIS — R0789 Other chest pain: Secondary | ICD-10-CM

## 2019-02-07 NOTE — Telephone Encounter (Signed)
Referral placed and MyChart message sent notifying patient.

## 2019-02-07 NOTE — Progress Notes (Signed)
am

## 2019-02-08 ENCOUNTER — Telehealth: Payer: Self-pay

## 2019-02-08 ENCOUNTER — Telehealth: Payer: Self-pay | Admitting: *Deleted

## 2019-02-08 NOTE — Telephone Encounter (Signed)
Called # [phone] and left a messaged we tried to reach pt for a follow up call. maw

## 2019-02-08 NOTE — Telephone Encounter (Signed)
  Follow up Call-  Call back number 02/04/2019  Post procedure Call Back phone  # [phone]  Permission to leave phone message Yes  Some recent data might be hidden    LMOM to call back if he has any questions or yes to the following questions-  1. Have you developed a fever since your procedure?   2.   Have you had an respiratory symptoms (SOB or cough) since your procedure?   3.   Have you tested positive for COVID 19 since your procedure   4.   Have you had any family members/close contacts diagnosed with the COVID 19 since your procedure?     If yes to any of these questions please route to Joylene John, RN and Alphonsa Gin, RN.

## 2019-02-08 NOTE — Telephone Encounter (Signed)
LM advising patient referral placed and their office would contact him for scheduling details, okay per DPR.

## 2019-02-12 ENCOUNTER — Encounter: Payer: Self-pay | Admitting: Gastroenterology

## 2019-02-14 ENCOUNTER — Other Ambulatory Visit: Payer: Self-pay | Admitting: Family Medicine

## 2019-02-15 ENCOUNTER — Other Ambulatory Visit: Payer: Self-pay | Admitting: Family Medicine

## 2019-02-16 NOTE — Telephone Encounter (Signed)
RF request for testosterone  Last OV 01/28/19/20 No upcoming OV Last RX 09/06/2018 # 21mL's, no refills Please advise.

## 2019-02-21 ENCOUNTER — Telehealth (HOSPITAL_COMMUNITY): Payer: Self-pay

## 2019-02-21 ENCOUNTER — Encounter: Payer: Self-pay | Admitting: Family Medicine

## 2019-02-21 NOTE — Telephone Encounter (Signed)
New message   Just an FYI. We have made several attempts to contact this patient including sending a letter to schedule or reschedule their Myocardial Perfusion . We will be removing the patient from the WQ.  7.13.20 @ 10:58am both # are the same -lm on home vm  6.29.20 @ 8:59am both # are the same  lm on home vm Joshua Mcconnell 6.29.20 mail reminder letter Joshua Mcconnell  6.23.20 @ 10:28am lm on home vm Joshua Mcconnell

## 2019-02-21 NOTE — Telephone Encounter (Signed)
Noted  

## 2019-03-05 ENCOUNTER — Encounter: Payer: Self-pay | Admitting: Family Medicine

## 2019-04-03 NOTE — Progress Notes (Signed)
Cardiology Office Note:    Date:  04/04/2019   ID:  Joshua Mcconnell, DOB February 01, 1961, MRN [id number]  PCP:  Tammi Sou, MD  Cardiologist:  No primary care provider on file.  Electrophysiologist:  None   Referring MD: Tammi Sou, MD   Chief complaint: chest pain/dyspnea   History of Present Illness:    Joshua Mcconnell is a 58 y.o. male with a hx of hyperlipidemia, obesity, hypogonadism, anxiety/depression who was referred by Dr. Anitra Lauth for evaluation of chest pain.  On 01/24/2019 he presented to the ED in [place] with epigastric/chest pain.   Reported substernal chest pain, describes at tightness and burning.  Started in evening after exerting himself throughout the day doing yardwork, no pain while doing yardwork but then started in the evening after he had stopped.  Went to ED.  Lasted about 24 hours.  EKG and troponins were negative.  Labs were unremarkable.  Chest x-ray showed borderline cardiomegaly and possible mild pulmonary vascular congestion.  He denies chest pain since the ED visit.  He was seen in clinic by Dr. Anitra Lauth and a nuclear stress test was ordered given his risk factors.  Stress test has not yet been scheduled as patient reports his insurance denied test.  He is unable to exercise much due to pain related to left knee replacement and right ankle reconstruction.  States that he feels short of breath with minimal activity.  Previously ran marathons, but now gets dyspneic with minimal exertion and has gained weight.  Most activity he does now is doing yardwork.  He was swimming prior to [place] but can't walk very much due to orthopedic issues.  Also reports mild LE edema.  Smokes cigars 2-3 times per week.    Past Medical History:  Diagnosis Date  . Anxiety and depression 2019   Improved with low dose sertraline and clonaz  . Atypical chest pain 01/2019   ordered stress test but denied by insurer  . Chronic fatigue   . DDD (degenerative disc disease), lumbar    L4-5 per pt (did PT at [facility] ortho)  . Family history of colon cancer    Sister at age 68  . Hx of adenomatous polyp of colon 2012/2015/2020   2012: tubular adenoma.  2015 Benign polyp--09/2013 ([facility]). 01/2019 adenomatous polyp (Dr. Loletha Carrow, [facility]) recall 5 yrs.  . Hyperlipidemia    Great response to statin.  Marland Kitchen Hypogonadism male   . Obesity, Class II, BMI 35-39.9   . Right ankle pain 03/2017   x-ray c/w soft tissue swelling anteriorly--? tendonitis.  Some osteoarthritis changes as well.    Past Surgical History:  Procedure Laterality Date  . COLONOSCOPY    . COLONOSCOPY W/ POLYPECTOMY  08/2010; 09/2013; 02/04/19   polypectomy (adenomatous) 01/2019->Recall 5 yrs  . POLYPECTOMY      Current Medications: Current Meds  Medication Sig  . atorvastatin (LIPITOR) 20 MG tablet TAKE 1 TABLET BY MOUTH EVERY DAY  . clonazePAM (KLONOPIN) 1 MG tablet Take 1 tablet (1 mg total) by mouth 2 (two) times daily as needed for anxiety.  . meloxicam (MOBIC) 15 MG tablet Take 1 tablet (15 mg total) by mouth daily.  . sertraline (ZOLOFT) 50 MG tablet Take 1 tablet (50 mg total) by mouth daily.  . SYRINGE-NEEDLE, DISP, 3 ML 23G X 1" 3 ML MISC 1 each by Does not apply route once a week.  . testosterone cypionate (DEPOTESTOSTERONE CYPIONATE) 200 MG/ML injection INJECT 1 ML INTRAMUSCULARLY ONCE  A WEEK     Allergies:   Patient has no known allergies.   Social History   Socioeconomic History  . Marital status: Married    Spouse name: Not on file  . Number of children: Not on file  . Years of education: Not on file  . Highest education level: Not on file  Occupational History  . Not on file  Social Needs  . Financial resource strain: Not on file  . Food insecurity    Worry: Not on file    Inability: Not on file  . Transportation needs    Medical: Not on file    Non-medical: Not on file  Tobacco Use  . Smoking status: Light Tobacco Smoker    Types: Cigars  . Smokeless tobacco: Never  Used  . Tobacco comment: 3 cigars per week  Substance and Sexual Activity  . Alcohol use: Yes    Alcohol/week: 20.0 standard drinks    Types: 10 Cans of beer, 10 Standard drinks or equivalent per week  . Drug use: No  . Sexual activity: Not on file  Lifestyle  . Physical activity    Days per week: Not on file    Minutes per session: Not on file  . Stress: Not on file  Relationships  . Social Herbalist on phone: Not on file    Gets together: Not on file    Attends religious service: Not on file    Active member of club or organization: Not on file    Attends meetings of clubs or organizations: Not on file    Relationship status: Not on file  Other Topics Concern  . Not on file  Social History Narrative   Married, 2 daughters.   Educ: college 4 yrs   Occupation: [employer] of [employer])   No tobacco.   Alc: 1-2 drinks most days.     Family History: Father: MI in early 46s.  Had multiple Mis and CVAs  ROS:   Please see the history of present illness.    All other systems reviewed and are negative.  EKGs/Labs/Other Studies Reviewed:    The following studies were reviewed today:  EKG:  EKG is ordered today.  The ekg ordered today demonstrates normal sinus rhythm at 72 bpm, normal axis, no ischemic changes  Recent Labs: 06/01/2018: Platelets 241.0; TSH 2.68 01/24/2019: ALT 45; BUN 16; Creatinine 1.0; Hemoglobin 15.3; Potassium 3.9; Sodium 137  Recent Lipid Panel    Component Value Date/Time   CHOL 184 06/01/2018 0758   TRIG 245.0 (H) 06/01/2018 0758   HDL 44.70 06/01/2018 0758   CHOLHDL 4 06/01/2018 0758   VLDL 49.0 (H) 06/01/2018 0758   LDLCALC 89 06/10/2017 0756   LDLDIRECT 119.0 06/01/2018 0758    Physical Exam:    VS:  BP 136/80   Pulse 72   Temp 97.9 F (36.6 C) (Temporal)   Ht 6' (1.829 m)   Wt 277 lb 6.4 oz (125.8 kg)   SpO2 93%   BMI 37.62 kg/m     Wt Readings from Last 3 Encounters:  04/04/19 277 lb 6.4 oz (125.8  kg)  02/04/19 277 lb (125.6 kg)  01/28/19 277 lb 12.8 oz (126 kg)     GEN: Obese, in no acute distress HEENT: Normal NECK: No JVD; No carotid bruits LYMPHATICS: No lymphadenopathy CARDIAC: RRR, no murmurs, rubs, gallops RESPIRATORY:  Clear to auscultation without rales, wheezing or rhonchi  ABDOMEN: Soft, non-tender, non-distended  MUSCULOSKELETAL:  Trace BLE edema; No deformity  SKIN: Warm and dry NEUROLOGIC:  Alert and oriented x 3 PSYCHIATRIC:  Normal affect   ASSESSMENT:    1. Precordial pain   2. DOE (dyspnea on exertion)   3. Pre-procedure lab exam   4. Hyperlipidemia, unspecified hyperlipidemia type    PLAN:    In order of problems listed above:  Chest pain/DOE : atypical chest pain.  Had episode of substernal chest tightness, did not occur with exertion.  However, also with dyspnea with minimal exertion that may represent anginal equivalent.  Risk factors include family history and HLD - Coronary CTA to evaluate for CAD - TTE to assess systolic/diastolic function and valvular disease given DOE and mild LE edema  HLD: On atorvastatin 20 mg daily.  LDL previously as high as 197 (03/2017).  Most recent LDL was 119 on 06/01/2018. - Continue atorvastatin   Medication Adjustments/Labs and Tests Ordered: Current medicines are reviewed at length with the patient today.  Concerns regarding medicines are outlined above.  Orders Placed This Encounter  Procedures  . CT CORONARY MORPH W/CTA COR W/SCORE W/CA W/CM &/OR WO/CM  . CT CORONARY FRACTIONAL FLOW RESERVE DATA PREP  . CT CORONARY FRACTIONAL FLOW RESERVE FLUID ANALYSIS  . Basic metabolic panel  . EKG 12-Lead  . ECHOCARDIOGRAM COMPLETE   Meds ordered this encounter  Medications  . metoprolol tartrate (LOPRESSOR) 100 MG tablet    Sig: TAKE 1 TABLET 2 HR PRIOR TO CARDIAC PROCEDURE    Dispense:  1 tablet    Refill:  0    Patient Instructions  Medication Instructions:  Your Physician recommend you continue on your  current medication as directed.    If you need a refill on your cardiac medications before your next appointment, please call your pharmacy.   Lab work: Your physician recommends that you return for lab work 1 week prior to procedure (BMP)  If you have labs (blood work) drawn today and your tests are completely normal, you will receive your results only by: Marland Kitchen MyChart Message (if you have MyChart) OR . A paper copy in the mail If you have any lab test that is abnormal or we need to change your treatment, we will call you to review the results.  Testing/Procedures: Your physician has requested that you have an echocardiogram. Echocardiography is a painless test that uses sound waves to create images of your heart. It provides your doctor with information about the size and shape of your heart and how well your heart's chambers and valves are working. This procedure takes approximately one hour. There are no restrictions for this procedure. [facility] has requested that you have cardiac CT. Cardiac computed tomography (CT) is a painless test that uses an x-ray machine to take clear, detailed pictures of your heart. For further information please visit HugeFiesta.tn. Please follow instruction sheet as given. [place]    Follow-Up: Your physician recommends that you schedule a follow-up appointment in 6 months with Dr. Gardiner Rhyme.  Your cardiac CT will be scheduled at one of the below locations:   [facility] [address] [place], [place] [address] [phone]  Please arrive at the [facility] main entrance of [facility] 30-45 minutes prior to test start time. Proceed to the [facility] Radiology Department (first floor) to check-in and test prep.  Please follow these instructions carefully (unless otherwise directed):  Hold all erectile dysfunction medications  at least 48 hours prior to test.  On the Night  Before the Test: . Be sure to Drink plenty of water. . Do not consume any caffeinated/decaffeinated beverages or chocolate 12 hours prior to your test. . Do not take any antihistamines 12 hours prior to your test.   On the Day of the Test: . Drink plenty of water. Do not drink any water within one hour of the test. . Do not eat any food 4 hours prior to the test. . You may take your regular medications prior to the test.  . Take metoprolol (Lopressor) two hours prior to test.        After the Test: . Drink plenty of water. . After receiving IV contrast, you may experience a mild flushed feeling. This is normal. . On occasion, you may experience a mild rash up to 24 hours after the test. This is not dangerous. If this occurs, you can take Benadryl 25 mg and increase your fluid intake. . If you experience trouble breathing, this can be serious. If it is severe call 911 IMMEDIATELY. If it is mild, please call our office. . If you take any of these medications: Glipizide/Metformin, Avandament, Glucavance, please do not take 48 hours after completing test.    Please contact the cardiac imaging nurse navigator should you have any questions/concerns Marchia Bond, RN Navigator Cardiac Imaging [facility] Heart and Vascular Services [phone] Office  [phone] Cell         Signed, Donato Heinz, MD  04/04/2019 12:02 PM    [place]

## 2019-04-04 ENCOUNTER — Ambulatory Visit: Payer: Managed Care, Other (non HMO) | Admitting: Cardiology

## 2019-04-04 ENCOUNTER — Other Ambulatory Visit: Payer: Self-pay

## 2019-04-04 VITALS — BP 136/80 | HR 72 | Temp 97.9°F | Ht 72.0 in | Wt 277.4 lb

## 2019-04-04 DIAGNOSIS — R072 Precordial pain: Secondary | ICD-10-CM | POA: Diagnosis not present

## 2019-04-04 DIAGNOSIS — E785 Hyperlipidemia, unspecified: Secondary | ICD-10-CM

## 2019-04-04 DIAGNOSIS — R0600 Dyspnea, unspecified: Secondary | ICD-10-CM

## 2019-04-04 DIAGNOSIS — R0609 Other forms of dyspnea: Secondary | ICD-10-CM

## 2019-04-04 DIAGNOSIS — Z01812 Encounter for preprocedural laboratory examination: Secondary | ICD-10-CM | POA: Diagnosis not present

## 2019-04-04 MED ORDER — METOPROLOL TARTRATE 100 MG PO TABS: ORAL_TABLET | ORAL | 0 refills | Status: DC

## 2019-04-04 NOTE — Patient Instructions (Addendum)
Medication Instructions:  Your Physician recommend you continue on your current medication as directed.    If you need a refill on your cardiac medications before your next appointment, please call your pharmacy.   Lab work: Your physician recommends that you return for lab work 1 week prior to procedure (BMP)  If you have labs (blood work) drawn today and your tests are completely normal, you will receive your results only by: Marland Kitchen MyChart Message (if you have MyChart) OR . A paper copy in the mail If you have any lab test that is abnormal or we need to change your treatment, we will call you to review the results.  Testing/Procedures: Your physician has requested that you have an echocardiogram. Echocardiography is a painless test that uses sound waves to create images of your heart. It provides your doctor with information about the size and shape of your heart and how well your heart's chambers and valves are working. This procedure takes approximately one hour. There are no restrictions for this procedure. [place] has requested that you have cardiac CT. Cardiac computed tomography (CT) is a painless test that uses an x-ray machine to take clear, detailed pictures of your heart. For further information please visit HugeFiesta.tn. Please follow instruction sheet as given. [facility]    Follow-Up: Your physician recommends that you schedule a follow-up appointment in 6 months with Dr. Gardiner Rhyme.  Your cardiac CT will be scheduled at one of the below locations:   [facility] [address] [place], [place] [address] [phone]  Please arrive at the [facility] main entrance of [facility] 30-45 minutes prior to test start time. Proceed to the [facility] Radiology Department (first floor) to check-in and test prep.  Please follow these instructions carefully (unless otherwise directed):  Hold all erectile  dysfunction medications at least 48 hours prior to test.  On the Night Before the Test: . Be sure to Drink plenty of water. . Do not consume any caffeinated/decaffeinated beverages or chocolate 12 hours prior to your test. . Do not take any antihistamines 12 hours prior to your test.   On the Day of the Test: . Drink plenty of water. Do not drink any water within one hour of the test. . Do not eat any food 4 hours prior to the test. . You may take your regular medications prior to the test.  . Take metoprolol (Lopressor) two hours prior to test.        After the Test: . Drink plenty of water. . After receiving IV contrast, you may experience a mild flushed feeling. This is normal. . On occasion, you may experience a mild rash up to 24 hours after the test. This is not dangerous. If this occurs, you can take Benadryl 25 mg and increase your fluid intake. . If you experience trouble breathing, this can be serious. If it is severe call 911 IMMEDIATELY. If it is mild, please call our office. . If you take any of these medications: Glipizide/Metformin, Avandament, Glucavance, please do not take 48 hours after completing test.    Please contact the cardiac imaging nurse navigator should you have any questions/concerns Marchia Bond, RN Navigator Cardiac [place] and Vascular Services [phone] Office  [phone] Cell

## 2019-04-11 ENCOUNTER — Other Ambulatory Visit (HOSPITAL_COMMUNITY): Payer: Managed Care, Other (non HMO)

## 2019-04-11 ENCOUNTER — Other Ambulatory Visit: Payer: Self-pay

## 2019-04-11 ENCOUNTER — Ambulatory Visit (HOSPITAL_COMMUNITY): Payer: Managed Care, Other (non HMO)

## 2019-04-15 ENCOUNTER — Other Ambulatory Visit: Payer: Self-pay

## 2019-04-15 ENCOUNTER — Ambulatory Visit (HOSPITAL_COMMUNITY): Payer: Managed Care, Other (non HMO) | Attending: Cardiovascular Disease

## 2019-04-15 ENCOUNTER — Telehealth (HOSPITAL_COMMUNITY): Payer: Self-pay | Admitting: Emergency Medicine

## 2019-04-15 DIAGNOSIS — R0609 Other forms of dyspnea: Secondary | ICD-10-CM | POA: Insufficient documentation

## 2019-04-15 HISTORY — PX: TRANSTHORACIC ECHOCARDIOGRAM: SHX275

## 2019-04-15 NOTE — Telephone Encounter (Signed)
Reaching out to patient to offer assistance regarding upcoming cardiac imaging study; pt verbalizes understanding of appt date/time, parking situation and where to check in, pre-test NPO status and medications ordered, and verified current allergies; name and call back number provided for further questions should they arise Marg Macmaster RN Navigator Cardiac Imaging [facility] [phone] office [phone] cell 

## 2019-04-16 LAB — BASIC METABOLIC PANEL
BUN/Creatinine Ratio: 13 (ref 9–20)
BUN: 15 mg/dL (ref 6–24)
CO2: 23 mmol/L (ref 20–29)
Calcium: 9.5 mg/dL (ref 8.7–10.2)
Chloride: 100 mmol/L (ref 96–106)
Creatinine, Ser: 1.14 mg/dL (ref 0.76–1.27)
GFR calc Af Amer: 83 mL/min/{1.73_m2} (ref >59)
GFR calc non Af Amer: 72 mL/min/{1.73_m2} (ref >59)
Glucose: 117 mg/dL — ABNORMAL HIGH (ref 65–99)
Potassium: 4.1 mmol/L (ref 3.5–5.2)
Sodium: 139 mmol/L (ref 134–144)

## 2019-04-19 ENCOUNTER — Other Ambulatory Visit: Payer: Self-pay

## 2019-04-19 ENCOUNTER — Ambulatory Visit (HOSPITAL_COMMUNITY)
Admission: RE | Admit: 2019-04-19 | Discharge: 2019-04-19 | Disposition: A | Discharge status: Home / Self Care | Payer: Managed Care, Other (non HMO) | Source: Ambulatory Visit | Attending: Cardiology | Admitting: Cardiology

## 2019-04-19 DIAGNOSIS — R072 Precordial pain: Secondary | ICD-10-CM | POA: Diagnosis not present

## 2019-04-19 MED ORDER — NITROGLYCERIN 0.4 MG SL SUBL
0.8000 mg | SUBLINGUAL_TABLET | SUBLINGUAL | Status: DC | PRN
  Administered 2019-04-19: 0.8 mg via SUBLINGUAL
  Filled 2019-04-19 (×2): qty 25

## 2019-04-19 MED ORDER — IOHEXOL 350 MG/ML SOLN
80.0000 mL | Freq: Once | INTRAVENOUS | Status: AC | PRN
  Administered 2019-04-19: 80 mL via INTRAVENOUS

## 2019-04-19 MED ORDER — NITROGLYCERIN 0.4 MG SL SUBL
SUBLINGUAL_TABLET | SUBLINGUAL | Status: AC
  Filled 2019-04-19: qty 2

## 2019-04-20 ENCOUNTER — Other Ambulatory Visit: Payer: Self-pay | Admitting: Cardiology

## 2019-04-20 DIAGNOSIS — R072 Precordial pain: Secondary | ICD-10-CM

## 2019-04-22 ENCOUNTER — Other Ambulatory Visit: Payer: Self-pay

## 2019-04-22 ENCOUNTER — Telehealth: Payer: Self-pay | Admitting: Cardiology

## 2019-04-22 MED ORDER — ATORVASTATIN CALCIUM 80 MG PO TABS: 80.0000 mg | ORAL_TABLET | Freq: Every day | ORAL | 3 refills | Status: DC

## 2019-04-22 NOTE — Telephone Encounter (Signed)
See below.  Thank you

## 2019-04-22 NOTE — Telephone Encounter (Signed)
Pt updated with results.

## 2019-04-22 NOTE — Telephone Encounter (Signed)
Patient returning call for CT results. 

## 2019-05-04 ENCOUNTER — Other Ambulatory Visit: Payer: Self-pay | Admitting: Family Medicine

## 2019-05-04 LAB — HEPATIC FUNCTION PANEL
Alkaline Phosphatase: 38 (ref 25–125)
Bilirubin, Total: 0.6

## 2019-05-04 LAB — BASIC METABOLIC PANEL
BUN: 15 (ref 4–21)
Creatinine: 1.1 (ref 0.6–1.3)
Glucose: 104
Potassium: 4.2 (ref 3.4–5.3)
Sodium: 139 (ref 137–147)

## 2019-05-04 LAB — CBC AND DIFFERENTIAL
HCT: 46 (ref 41–53)
Hemoglobin: 15.1 (ref 13.5–17.5)
Platelets: 232 (ref 150–399)
WBC: 8.4

## 2019-05-04 NOTE — Telephone Encounter (Signed)
RF request for Meloxicam 15mg  LOV: 01/28/19 Next ov: not scheduled. Supposed to follow up in December for a CPE Last written: 05/28/18 #90 x 3 RF  Please advise, thank you

## 2019-05-10 ENCOUNTER — Other Ambulatory Visit: Payer: Self-pay | Admitting: Family Medicine

## 2019-05-10 NOTE — Telephone Encounter (Signed)
Requesting: Testosterone Contract: 05/28/18 UDS: n/a, last testosterone check 06/01/18 Last Visit: 01/28/19 Next Visit: advised to f/u 6 mo. Last Refill: 02/16/19 (83mL,0)  Please Advise

## 2019-05-13 ENCOUNTER — Other Ambulatory Visit: Payer: Self-pay | Admitting: Family Medicine

## 2019-05-18 DIAGNOSIS — M1712 Unilateral primary osteoarthritis, left knee: Secondary | ICD-10-CM | POA: Insufficient documentation

## 2019-05-18 HISTORY — PX: TOTAL KNEE ARTHROPLASTY: SHX125

## 2019-05-25 ENCOUNTER — Encounter: Payer: Self-pay | Admitting: Family Medicine

## 2019-06-02 ENCOUNTER — Encounter: Payer: Self-pay | Admitting: Family Medicine

## 2019-06-14 DIAGNOSIS — M21171 Varus deformity, not elsewhere classified, right ankle: Secondary | ICD-10-CM | POA: Insufficient documentation

## 2019-07-27 ENCOUNTER — Ambulatory Visit: Payer: Managed Care, Other (non HMO) | Admitting: Family Medicine

## 2019-07-27 ENCOUNTER — Encounter: Payer: Self-pay | Admitting: Family Medicine

## 2019-07-27 ENCOUNTER — Other Ambulatory Visit: Payer: Self-pay

## 2019-07-27 VITALS — BP 148/92 | HR 81 | Temp 98.6°F | Resp 16 | Ht 72.0 in | Wt 283.8 lb

## 2019-07-27 DIAGNOSIS — G8929 Other chronic pain: Secondary | ICD-10-CM | POA: Diagnosis not present

## 2019-07-27 DIAGNOSIS — M25571 Pain in right ankle and joints of right foot: Secondary | ICD-10-CM

## 2019-07-27 DIAGNOSIS — M19071 Primary osteoarthritis, right ankle and foot: Secondary | ICD-10-CM | POA: Diagnosis not present

## 2019-07-27 MED ORDER — HYDROCODONE-ACETAMINOPHEN 5-325 MG PO TABS: 1.0000 | ORAL_TABLET | Freq: Four times a day (QID) | ORAL | 0 refills | Status: DC | PRN

## 2019-07-27 MED ORDER — DICLOFENAC SODIUM 1 % EX GEL: 2.0000 g | Freq: Four times a day (QID) | CUTANEOUS | 3 refills | Status: DC

## 2019-07-27 MED ORDER — METHYLPREDNISOLONE ACETATE 80 MG/ML IJ SUSP
80.0000 mg | Freq: Once | INTRAMUSCULAR | Status: AC
  Administered 2019-07-27: 80 mg via INTRAMUSCULAR

## 2019-07-27 NOTE — Progress Notes (Signed)
OFFICE VISIT  07/27/2019   CC:  Chief Complaint  Patient presents with  . Ankle Pain    right   HPI:    Patient is a 58 y.o. Caucasian male who presents for right ankle pain/osteoarthritis.Marland Kitchen Has been moving to new house today, LOTS of steps and can barely walk on ankle right now. Minimal swelling.  WEars brace.  Meloxicam not helping much at all. He asks if he can get a steroid injection.  No pallor or bluish hue of foot.     He has long hx of R ankle pain, dx'd with advanced arthritis in ankle.  Has had multiple ortho specialist opinions. Most recent eval 06/09/19 with [facility] -->plan is for total ankle arthroplasty (scheduled for March 2021).    Mobic.  Pertinent imaging->03/14/17 EXAM: RIGHT ANKLE - COMPLETE 3+ VIEW  COMPARISON:  None.  FINDINGS: Ankle mortise intact. The talar dome is normal. No malleolar fracture. The calcaneus is normal. The soft tissue swelling anterior to the tibiotalar joint. Mild spurring of the tibia and talus in the vicinity.  IMPRESSION: No fracture or dislocation. Mild soft tissue inflammation anterior tibiotalar joint associated with tibial spurring.  Past Medical History:  Diagnosis Date  . Anxiety and depression 2019   Improved with low dose sertraline and clonaz  . Atypical chest pain 01/2019   ordered stress test but denied by insurer  . Chronic fatigue   . DDD (degenerative disc disease), lumbar    L4-5 per pt (did PT at [facility] ortho)  . Family history of colon cancer    Sister at age 72  . Hx of adenomatous polyp of colon 2012/2015/2020   2012: tubular adenoma.  2015 Benign polyp--09/2013 ([facility]). 01/2019 adenomatous polyp (Dr. Loletha Carrow, [facility]) recall 5 yrs.  . Hyperlipidemia    Great response to statin.  Marland Kitchen Hypogonadism male   . Obesity, Class II, BMI 35-39.9   . Right ankle pain 03/2017   x-ray c/w soft tissue swelling anteriorly--? tendonitis.  Some osteoarthritis changes as well.    Past Surgical  History:  Procedure Laterality Date  . COLONOSCOPY    . COLONOSCOPY W/ POLYPECTOMY  08/2010; 09/2013; 02/04/19   polypectomy (adenomatous) 01/2019->Recall 5 yrs  . CT CORONARY CA SCORING  2020   Score 68.4,nonobst cad, echo 04/2019 normal.  . POLYPECTOMY    . TOTAL KNEE ARTHROPLASTY Left 05/18/2019   Dr. Tacey Ruiz [facility])  . TRANSTHORACIC ECHOCARDIOGRAM  04/15/2019   EF 55-60%, nl wall motion.    Outpatient Medications Prior to Visit  Medication Sig Dispense Refill  . atorvastatin (LIPITOR) 80 MG tablet Take 1 tablet (80 mg total) by mouth daily. 90 tablet 3  . clonazePAM (KLONOPIN) 1 MG tablet Take 1 tablet (1 mg total) by mouth 2 (two) times daily as needed for anxiety. 180 tablet 1  . sertraline (ZOLOFT) 50 MG tablet TAKE 1 TABLET BY MOUTH EVERY DAY 90 tablet 0  . SYRINGE-NEEDLE, DISP, 3 ML 23G X 1" 3 ML MISC 1 each by Does not apply route once a week. 100 each 11  . testosterone cypionate (DEPOTESTOSTERONE CYPIONATE) 200 MG/ML injection INJECT 1 ML INTRAMUSCULARLY ONCE A WEEK 20 mL 0  . GABAPENTIN PO Take by mouth.    . meloxicam (MOBIC) 15 MG tablet TAKE 1 TABLET BY MOUTH EVERY DAY 90 tablet 3  . metoprolol tartrate (LOPRESSOR) 100 MG tablet TAKE 1 TABLET 2 HR PRIOR TO CARDIAC PROCEDURE (Patient not taking: Reported on 07/27/2019) 1 tablet 0  No facility-administered medications prior to visit.    No Known Allergies  ROS As per HPI  PE: Blood pressure (!) 148/92, pulse 81, temperature 98.6 F (37 C), temperature source Temporal, resp. rate 16, height 6' (1.829 m), weight 283 lb 12.8 oz (128.7 kg), SpO2 93 %. Gen: Alert, well appearing.  Patient is oriented to person, place, time, and situation. AFFECT: pleasant, lucid thought and speech. R ankle with mild medial deviation deformity. Mild/mod pain with flexion and extension at ankle. Minimal TTP, mostly over anterol lateral ankle.  DP and PT pulses 2+ bilat. Foot pink, warm (he has been wearing a brace).  Strength intact.     LABS:    Chemistry      Component Value Date/Time   NA 139 05/04/2019 0000   K 4.2 05/04/2019 0000   CL 100 04/15/2019 1548   CO2 23 04/15/2019 1548   BUN 15 05/04/2019 0000   CREATININE 1.1 05/04/2019 0000   CREATININE 1.14 04/15/2019 1548   CREATININE 1.09 03/11/2017 1327   GLU 104 05/04/2019 0000      Component Value Date/Time   CALCIUM 9.5 04/15/2019 1548   ALKPHOS 38 05/04/2019 0000   AST 37 01/24/2019 0000   ALT 45 (A) 01/24/2019 0000   BILITOT 0.8 06/01/2018 0758      IMPRESSION AND PLAN:  Chronic R ankle pain; plan for total ankle joint replacement at [facility] 10/2019. Acute flare of pain today due to excessive walking and heavy lifting while moving houses. Depo medrol 80mg  IM today. Stop meloxicam. Start trial of voltaren gel 2g qid. Vicodin 5/325, 1-2 q6h prn, #42.  Therapeutic expectations and side effect profile of medication discussed today.  Patient's questions answered. Continue ice, rest, brace.  Spent 25 min with pt today, with >50% of this time spent in counseling and care coordination regarding the above problems.  An After Visit Summary was printed and given to the patient.  FOLLOW UP: Return if symptoms worsen or fail to improve.  Signed:  Crissie Sickles, MD           07/27/2019

## 2019-07-27 NOTE — Addendum Note (Signed)
Addended by: Deveron Furlong D on: 07/27/2019 04:44 PM   Modules accepted: Orders

## 2019-08-10 ENCOUNTER — Other Ambulatory Visit: Payer: Self-pay | Admitting: Family Medicine

## 2019-09-20 ENCOUNTER — Telehealth: Payer: Self-pay | Admitting: *Deleted

## 2019-09-20 NOTE — Telephone Encounter (Signed)
Mr.Shavers was in a meeting,I ask him to give [place] a call back.

## 2019-10-10 DIAGNOSIS — U071 COVID-19: Secondary | ICD-10-CM

## 2019-10-10 HISTORY — DX: COVID-19: U07.1

## 2019-10-14 HISTORY — PX: TOTAL ANKLE ARTHROPLASTY: SHX811

## 2019-10-24 ENCOUNTER — Encounter: Payer: Self-pay | Admitting: Cardiology

## 2019-10-31 ENCOUNTER — Other Ambulatory Visit: Payer: Self-pay

## 2019-10-31 MED ORDER — "SYRINGE/NEEDLE (DISP) 23G X 1"" 3 ML MISC": 1.0000 | 11 refills | Status: DC

## 2019-11-03 ENCOUNTER — Other Ambulatory Visit: Payer: Self-pay | Admitting: Family Medicine

## 2019-11-06 ENCOUNTER — Other Ambulatory Visit: Payer: Self-pay | Admitting: Family Medicine

## 2019-11-07 NOTE — Telephone Encounter (Signed)
Requesting:Clonazepam Contract:05/28/18 UDS:n/a Last Visit:07/27/19, acute Next Visit: advised to f/u Dec for CPE Last Refill:12/13/18(180,1)  Please Advise. Medication pending

## 2019-11-07 NOTE — Telephone Encounter (Signed)
Reviewed chart. Pt just had a big ankle surgery and is in pretty intense rehab.  I'll do #180 with no RF but he needs to come in for CPE or anxiety med f/u prior to any FURTHER RF's.-thx

## 2019-11-08 NOTE — Telephone Encounter (Signed)
LM for pt to returncall

## 2019-11-10 NOTE — Telephone Encounter (Signed)
Patient called and scheduled for CPE, 4/23

## 2019-11-26 ENCOUNTER — Other Ambulatory Visit: Payer: Self-pay | Admitting: Family Medicine

## 2019-11-28 NOTE — Telephone Encounter (Signed)
RF sent.

## 2019-11-28 NOTE — Telephone Encounter (Signed)
Requesting: testosterone Contract:05/28/18 UDS:n/a, last testosterone check 06/01/18 Last Visit:07/27/19 Next Visit:12/02/19 Last Refill:05/10/19(6mL,0)  Please Advise. Medication pending

## 2019-12-01 ENCOUNTER — Other Ambulatory Visit: Payer: Self-pay

## 2019-12-02 ENCOUNTER — Encounter: Payer: Self-pay | Admitting: Family Medicine

## 2019-12-02 ENCOUNTER — Other Ambulatory Visit: Payer: Self-pay

## 2019-12-02 ENCOUNTER — Ambulatory Visit (INDEPENDENT_AMBULATORY_CARE_PROVIDER_SITE_OTHER): Payer: Managed Care, Other (non HMO) | Admitting: Family Medicine

## 2019-12-02 VITALS — BP 133/86 | HR 76 | Temp 98.0°F | Resp 16 | Ht 72.0 in | Wt 259.2 lb

## 2019-12-02 DIAGNOSIS — E291 Testicular hypofunction: Secondary | ICD-10-CM | POA: Diagnosis not present

## 2019-12-02 DIAGNOSIS — E7800 Pure hypercholesterolemia, unspecified: Secondary | ICD-10-CM

## 2019-12-02 DIAGNOSIS — Z0000 Encounter for general adult medical examination without abnormal findings: Secondary | ICD-10-CM

## 2019-12-02 DIAGNOSIS — Z125 Encounter for screening for malignant neoplasm of prostate: Secondary | ICD-10-CM | POA: Diagnosis not present

## 2019-12-02 NOTE — Patient Instructions (Signed)

## 2019-12-02 NOTE — Progress Notes (Signed)
Office Note 12/02/2019  CC:  Chief Complaint  Patient presents with  . Annual Exam    pt is not fasting   HPI:  Joshua Mcconnell is a 59 y.o. White male who is here for annual health maintenance exam.  He is 7 weeks out from R ankle replacement and foot surgery/stabilization. Getting better gradually. Otherwise feeling good, has purposefully lost 23 lbs in the last 4 mo!  PMP AWARE reviewed today: most recent rx for clonazepam 1mg  was filled 11/07/19, # 180, rx by me. No red flags.   Past Medical History:  Diagnosis Date  . Anxiety and depression 2019   Improved with low dose sertraline and clonaz  . Atypical chest pain 01/2019   ordered stress test but denied by insurer  . Chronic fatigue   . DDD (degenerative disc disease), lumbar    L4-5 per pt (did PT at [facility])  . Family history of colon cancer    Sister at age 59  . Hx of adenomatous polyp of colon 2012/2015/2020   2012: tubular adenoma.  2015 Benign polyp--09/2013 ([facility]). 01/2019 adenomatous polyp (Dr. Loletha Carrow, [facility]) recall 5 yrs.  . Hyperlipidemia    Great response to statin.  Marland Kitchen Hypogonadism male   . Obesity, Class II, BMI 35-39.9   . Right ankle pain 03/2017   x-ray c/w soft tissue swelling anteriorly--? tendonitis.  Some osteoarthritis changes as well.-->Total ankle arthroplasty 10/2019.    Past Surgical History:  Procedure Laterality Date  . COLONOSCOPY    . COLONOSCOPY W/ POLYPECTOMY  08/2010; 09/2013; 02/04/19   polypectomy (adenomatous) 01/2019->Recall 5 yrs  . CT CORONARY CA SCORING  2020   Score 68.4,nonobst cad, echo 04/2019 normal.  . POLYPECTOMY    . TOTAL ANKLE ARTHROPLASTY Right 10/14/2019  . TOTAL KNEE ARTHROPLASTY Left 05/18/2019   Dr. Tacey Ruiz [facility])  . TRANSTHORACIC ECHOCARDIOGRAM  04/15/2019   EF 55-60%, nl wall motion.    Family History  Problem Relation Age of Onset  . Diabetes Mother   . Arthritis Mother   . Breast cancer Mother   . Heart disease Father   .  Stroke Father   . Hypertension Father   . Colon cancer Sister 4  . Colon polyps Brother   . Arthritis Maternal Grandmother   . Esophageal cancer Neg Hx   . Rectal cancer Neg Hx   . Stomach cancer Neg Hx     Social History   Socioeconomic History  . Marital status: Married    Spouse name: Not on file  . Number of children: Not on file  . Years of education: Not on file  . Highest education level: Not on file  Occupational History  . Not on file  Tobacco Use  . Smoking status: Light Tobacco Smoker    Types: Cigars  . Smokeless tobacco: Never Used  . Tobacco comment: 3 cigars per week  Substance and Sexual Activity  . Alcohol use: Yes    Alcohol/week: 20.0 standard drinks    Types: 10 Cans of beer, 10 Standard drinks or equivalent per week  . Drug use: No  . Sexual activity: Not on file  Other Topics Concern  . Not on file  Social History Narrative   Married, 2 daughters.   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   Alc: 1-2 drinks most days.   Social Determinants of Health   Financial Resource Strain:   .  Difficulty of Paying Living Expenses:   Food Insecurity:   . Worried About Charity fundraiser in the Last Year:   . Arboriculturist in the Last Year:   Transportation Needs:   . Film/video editor (Medical):   Marland Kitchen Lack of Transportation (Non-Medical):   Physical Activity:   . Days of Exercise per Week:   . Minutes of Exercise per Session:   Stress:   . Feeling of Stress :   Social Connections:   . Frequency of Communication with Friends and Family:   . Frequency of Social Gatherings with Friends and Family:   . Attends Religious Services:   . Active Member of Clubs or Organizations:   . Attends Archivist Meetings:   Marland Kitchen Marital Status:   Intimate Partner Violence:   . Fear of Current or Ex-Partner:   . Emotionally Abused:   Marland Kitchen Physically Abused:   . Sexually Abused:     Outpatient  Medications Prior to Visit  Medication Sig Dispense Refill  . atorvastatin (LIPITOR) 80 MG tablet Take 1 tablet (80 mg total) by mouth daily. 90 tablet 3  . clonazePAM (KLONOPIN) 1 MG tablet TAKE 1 TABLET (1 MG TOTAL) BY MOUTH 2 (TWO) TIMES DAILY AS NEEDED FOR ANXIETY. 180 tablet 0  . sertraline (ZOLOFT) 50 MG tablet Take 1 tablet (50 mg total) by mouth daily. Patient needs office visit for further refills.  Due for Physical. 90 tablet 0  . SYRINGE-NEEDLE, DISP, 3 ML 23G X 1" 3 ML MISC 1 each by Does not apply route once a week. 100 each 11  . testosterone cypionate (DEPOTESTOSTERONE CYPIONATE) 200 MG/ML injection INJECT 1 ML INTRAMUSCULARLY ONCE A WEEK 12 mL 0  . diclofenac Sodium (VOLTAREN) 1 % GEL Apply 2 g topically 4 (four) times daily. (Patient not taking: Reported on 12/02/2019) 100 g 3  . gabapentin (NEURONTIN) 300 MG capsule Take 300 mg by mouth at bedtime as needed.    . meloxicam (MOBIC) 15 MG tablet Take 15 mg by mouth daily.    . ondansetron (ZOFRAN-ODT) 4 MG disintegrating tablet Take 4 mg by mouth every 8 (eight) hours as needed.    Marland Kitchen oxyCODONE (OXY IR/ROXICODONE) 5 MG immediate release tablet TAKE 1 2 TABLETS (5 10 MG TOTAL) BY MOUTH EVERY 4 (FOUR) HOURS AS NEEDED FOR PAIN FOR UP TO 7 DAYS    . traMADol (ULTRAM) 50 MG tablet Take 50 mg by mouth every 8 (eight) hours as needed.     No facility-administered medications prior to visit.    No Known Allergies  ROS Review of Systems  Constitutional: Negative for fatigue and fever.  HENT: Negative for congestion and sore throat.   Eyes: Negative for visual disturbance.  Respiratory: Negative for cough.   Cardiovascular: Negative for chest pain.  Gastrointestinal: Negative for abdominal pain and nausea.  Genitourinary: Negative for dysuria.  Musculoskeletal: Negative for back pain and joint swelling.  Skin: Negative for rash.  Neurological: Negative for weakness and headaches.  Hematological: Negative for adenopathy.     PE; Vitals with BMI 12/02/2019 07/27/2019 04/19/2019  Height 6\' 0"  6\' 0"  -  Weight 259 lbs 3 oz 283 lbs 13 oz -  BMI [id number] -  Systolic [id number]  Diastolic 86 92 66  Pulse 76 81 67    Gen: Alert, well appearing.  Patient is oriented to person, place, time, and situation. AFFECT: pleasant, lucid thought and speech. ENT: Ears: EACs clear, normal epithelium.  TMs with good light reflex and landmarks bilaterally.  Eyes: no injection, icteris, swelling, or exudate.  EOMI, PERRLA. Nose: no drainage or turbinate edema/swelling.  No injection or focal lesion.  Mouth: lips without lesion/swelling.  Oral mucosa pink and moist.  Dentition intact and without obvious caries or gingival swelling.  Oropharynx without erythema, exudate, or swelling.  Neck: supple/nontender.  No LAD, mass, or TM.  Carotid pulses 2+ bilaterally, without bruits. CV: RRR, no m/r/g.   LUNGS: CTA bilat, nonlabored resps, good aeration in all lung fields. ABD: soft, NT, ND, BS normal.  No hepatospenomegaly or mass.  No bruits. EXT: no clubbing, cyanosis, or edema.  Musculoskeletal: no joint swelling, erythema, warmth, or tenderness.  ROM of all joints intact. Skin - no sores or suspicious lesions or rashes or color changes Rectal exam: negative without mass, lesions or tenderness, PROSTATE EXAM: smooth and symmetric without nodules or tenderness.   Pertinent labs:  Lab Results  Component Value Date   TSH 2.68 06/01/2018   Lab Results  Component Value Date   WBC 8.4 05/04/2019   HGB 15.1 05/04/2019   HCT 46 05/04/2019   MCV 87.4 06/01/2018   PLT 232 05/04/2019   Lab Results  Component Value Date   CREATININE 1.1 05/04/2019   BUN 15 05/04/2019   NA 139 05/04/2019   K 4.2 05/04/2019   CL 100 04/15/2019   CO2 23 04/15/2019   Lab Results  Component Value Date   ALT 45 (A) 01/24/2019   AST 37 01/24/2019   ALKPHOS 38 05/04/2019   BILITOT 0.8 06/01/2018   Lab Results  Component Value Date   CHOL  184 06/01/2018   Lab Results  Component Value Date   HDL 44.70 06/01/2018   Lab Results  Component Value Date   LDLCALC 89 06/10/2017   Lab Results  Component Value Date   TRIG 245.0 (H) 06/01/2018   Lab Results  Component Value Date   CHOLHDL 4 06/01/2018   Lab Results  Component Value Date   PSA 0.84 06/01/2018   PSA 0.6 03/11/2017   PSA 0.98 01/04/2016   Lab Results  Component Value Date   TESTOSTERONE 123.81 (L) 06/01/2018    ASSESSMENT AND PLAN:   Health maintenance exam: Reviewed age and gender appropriate health maintenance issues (prudent diet, regular exercise, health risks of tobacco and excessive alcohol, use of seatbelts, fire alarms in home, use of sunscreen).  Also reviewed age and gender appropriate health screening as well as vaccine recommendations. Vaccines: Tdap UTD.  Covid pfizer x 2 already done. Labs: fasting lipids,TSH, PSA, testosterone level (hyperchol, male hypogonad)->future lab appt when fasting. He had Hb 15.4 on 10/13/19 at his ortho surgeon's office, so I will forego repeat of this at this time.  Same with CMET (normal 10/13/19). Prostate ca screening:  DRE normal today , PSA--future. Colon ca screening: recall 2025.  Anx/dep: stable on sertraline and clonaz. No new rx today. CSC updated today.  Hypogonadism: stable on testost injections 200 mg q week. Hb 10/13/19 15.4. Plan repeat Hb monitoring at next f/u in 19mo. Testosterone level future when fasting. PSA monitoring future ordered.  An After Visit Summary was printed and given to the patient.  FOLLOW UP:  Return for 6 mo office f/u; also fasting lab appt at his earliest convenience.  Signed:  Crissie Sickles, MD           12/02/2019

## 2019-12-09 ENCOUNTER — Ambulatory Visit (INDEPENDENT_AMBULATORY_CARE_PROVIDER_SITE_OTHER): Payer: Managed Care, Other (non HMO) | Admitting: Family Medicine

## 2019-12-09 ENCOUNTER — Other Ambulatory Visit: Payer: Self-pay

## 2019-12-09 DIAGNOSIS — E7800 Pure hypercholesterolemia, unspecified: Secondary | ICD-10-CM | POA: Diagnosis not present

## 2019-12-09 DIAGNOSIS — E291 Testicular hypofunction: Secondary | ICD-10-CM

## 2019-12-09 DIAGNOSIS — Z125 Encounter for screening for malignant neoplasm of prostate: Secondary | ICD-10-CM

## 2019-12-09 LAB — TSH: TSH: 2.3 u[IU]/mL (ref 0.35–4.50)

## 2019-12-09 LAB — LIPID PANEL
Cholesterol: 163 mg/dL (ref 0–200)
HDL: 45.7 mg/dL (ref >39.00)
NonHDL: 117.6
Total CHOL/HDL Ratio: 4
Triglycerides: 256 mg/dL — ABNORMAL HIGH (ref 0.0–149.0)
VLDL: 51.2 mg/dL — ABNORMAL HIGH (ref 0.0–40.0)

## 2019-12-09 LAB — TESTOSTERONE: Testosterone: 1029.65 ng/dL — ABNORMAL HIGH (ref 300.00–890.00)

## 2019-12-09 LAB — PSA: PSA: 10.82 ng/mL — ABNORMAL HIGH (ref 0.10–4.00)

## 2019-12-09 LAB — LDL CHOLESTEROL, DIRECT: Direct LDL: 77 mg/dL

## 2019-12-10 ENCOUNTER — Encounter: Payer: Self-pay | Admitting: Family Medicine

## 2019-12-10 DIAGNOSIS — R972 Elevated prostate specific antigen [PSA]: Secondary | ICD-10-CM

## 2019-12-10 HISTORY — DX: Elevated prostate specific antigen (PSA): R97.20

## 2019-12-12 ENCOUNTER — Encounter: Payer: Self-pay | Admitting: Family Medicine

## 2019-12-12 ENCOUNTER — Other Ambulatory Visit: Payer: Self-pay

## 2019-12-12 DIAGNOSIS — R972 Elevated prostate specific antigen [PSA]: Secondary | ICD-10-CM

## 2019-12-19 ENCOUNTER — Telehealth: Payer: Self-pay

## 2019-12-19 NOTE — Telephone Encounter (Signed)
Patient sent the following mychart message very concerned that urology referral was not put in as urgent. "I tried to make appointment with [facility]. Due to the fact u did not mark this as urgent and due to my family history I think it is urgent they will not see me for a month. Can you please expedite this is weighing on my mind, my Sister died of Cancer and my mother had Breast Cancer. Please respond to them ASAP". Advised patient to contact their office to get on waitlist/cancellation. Patient then called office and stated "the way we put in the referral was not working for him"  Please advise, thanks.

## 2019-12-19 NOTE — Telephone Encounter (Signed)
Pls ask the girls up front to see if another urology office can see him before [facility] can ([facility] in [place], or [place], or [place]).  Let me know and I'll re-enter the referral-thx

## 2019-12-21 ENCOUNTER — Other Ambulatory Visit: Payer: Self-pay | Admitting: Family Medicine

## 2019-12-21 DIAGNOSIS — R972 Elevated prostate specific antigen [PSA]: Secondary | ICD-10-CM

## 2019-12-21 DIAGNOSIS — E291 Testicular hypofunction: Secondary | ICD-10-CM

## 2019-12-21 NOTE — Telephone Encounter (Signed)
MyChart message read.

## 2019-12-21 NOTE — Telephone Encounter (Signed)
Its June for every office I called.  If he needs to get see quicker, then please update the referral to urgent.  Thanks!

## 2019-12-21 NOTE — Telephone Encounter (Signed)
OK, referral order entered now as urgent. Pls notify pt we are working on this. Also, tell him that sometimes testosterone will cause the PSA to go up so I want him to stop using testosterone.-thx

## 2019-12-22 ENCOUNTER — Telehealth: Payer: Self-pay | Admitting: Family Medicine

## 2019-12-22 NOTE — Telephone Encounter (Signed)
Lmom yesterday afternoon for patient to call me about his urology referral.  Left him my direct line - [phone].

## 2020-01-01 ENCOUNTER — Encounter: Payer: Self-pay | Admitting: Family Medicine

## 2020-01-02 NOTE — Telephone Encounter (Signed)
Appt scheduled with Dr Anitra Lauth

## 2020-01-04 ENCOUNTER — Encounter: Payer: Self-pay | Admitting: Family Medicine

## 2020-01-04 ENCOUNTER — Other Ambulatory Visit: Payer: Self-pay

## 2020-01-04 ENCOUNTER — Telehealth (INDEPENDENT_AMBULATORY_CARE_PROVIDER_SITE_OTHER): Payer: Managed Care, Other (non HMO) | Admitting: Family Medicine

## 2020-01-04 ENCOUNTER — Other Ambulatory Visit: Payer: Self-pay | Admitting: Family Medicine

## 2020-01-04 DIAGNOSIS — G4700 Insomnia, unspecified: Secondary | ICD-10-CM | POA: Diagnosis not present

## 2020-01-04 DIAGNOSIS — R972 Elevated prostate specific antigen [PSA]: Secondary | ICD-10-CM

## 2020-01-04 DIAGNOSIS — E291 Testicular hypofunction: Secondary | ICD-10-CM | POA: Diagnosis not present

## 2020-01-04 MED ORDER — TRAZODONE HCL 50 MG PO TABS: ORAL_TABLET | ORAL | 1 refills | Status: DC

## 2020-01-04 MED ORDER — CLONAZEPAM 0.5 MG PO TABS: ORAL_TABLET | ORAL | 0 refills | Status: DC

## 2020-01-04 NOTE — Progress Notes (Signed)
Virtual Visit via Video Note  I connected with pt on 01/04/20 at  4:00 PM EDT by a video enabled telemedicine application and verified that I am speaking with the correct person using two identifiers.  Location patient: home Location provider:work or home office Persons participating in the virtual visit: patient, provider  I discussed the limitations of evaluation and management by telemedicine and the availability of in person appointments. The patient expressed understanding and agreed to proceed.  Telemedicine visit is a necessity given the COVID-19 restrictions in place at the current time.  HPI: 59 y/o WM being seen today for "medication change". Started seeing a counselor and they recommended he try to get off the clonazepam and do trial of trazodone for his sleep.  He has been taking clonaz every night for a couple years.    Also, had PSA recently here at his CPE that was 10. I referred him to urology and he saw them already. Office notes from this not available to me today. Pt reports repeat of his PSA came back 2.9. Recheck PSA here in 6 wks is the plan.    ROS: See pertinent positives and negatives per HPI.  Past Medical History:  Diagnosis Date  . Anxiety and depression 2019   Improved with low dose sertraline and clonaz  . Atypical chest pain 01/2019   ordered stress test but denied by insurer  . Chronic fatigue   . DDD (degenerative disc disease), lumbar    L4-5 per pt (did PT at [facility])  . Elevated PSA 12/2019   Recommended urology referral 12/12/19  . Family history of colon cancer    Sister at age 55  . Hx of adenomatous polyp of colon 2012/2015/2020   2012: tubular adenoma.  2015 Benign polyp--09/2013 ([facility]). 01/2019 adenomatous polyp (Dr. Loletha Carrow, [facility]) recall 5 yrs.  . Hyperlipidemia    Great response to statin.  Marland Kitchen Hypogonadism male   . Obesity, Class II, BMI 35-39.9   . Right ankle pain 03/2017   x-ray c/w soft tissue swelling  anteriorly--? tendonitis.  Some osteoarthritis changes as well.-->Total ankle arthroplasty 10/2019.    Past Surgical History:  Procedure Laterality Date  . COLONOSCOPY    . COLONOSCOPY W/ POLYPECTOMY  08/2010; 09/2013; 02/04/19   polypectomy (adenomatous) 01/2019->Recall 5 yrs  . CT CORONARY CA SCORING  2020   Score 68.4,nonobst cad, echo 04/2019 normal.  . POLYPECTOMY    . TOTAL ANKLE ARTHROPLASTY Right 10/14/2019  . TOTAL KNEE ARTHROPLASTY Left 05/18/2019   Dr. Tacey Ruiz [facility])  . TRANSTHORACIC ECHOCARDIOGRAM  04/15/2019   EF 55-60%, nl wall motion.    Family History  Problem Relation Age of Onset  . Diabetes Mother   . Arthritis Mother   . Breast cancer Mother   . Heart disease Father   . Stroke Father   . Hypertension Father   . Colon cancer Sister 37  . Colon polyps Brother   . Arthritis Maternal Grandmother   . Esophageal cancer Neg Hx   . Rectal cancer Neg Hx   . Stomach cancer Neg Hx      Current Outpatient Medications:  .  clonazePAM (KLONOPIN) 1 MG tablet, TAKE 1 TABLET (1 MG TOTAL) BY MOUTH 2 (TWO) TIMES DAILY AS NEEDED FOR ANXIETY., Disp: 180 tablet, Rfl: 0 .  sertraline (ZOLOFT) 50 MG tablet, Take 1 tablet (50 mg total) by mouth daily. Patient needs office visit for further refills.  Due for Physical., Disp: 90 tablet, Rfl: 0 .  atorvastatin (LIPITOR) 80 MG tablet, Take 1 tablet (80 mg total) by mouth daily. (Patient not taking: Reported on 01/04/2020), Disp: 90 tablet, Rfl: 3 .  SYRINGE-NEEDLE, DISP, 3 ML 23G X 1" 3 ML MISC, 1 each by Does not apply route once a week. (Patient not taking: Reported on 01/04/2020), Disp: 100 each, Rfl: 11 .  testosterone cypionate (DEPOTESTOSTERONE CYPIONATE) 200 MG/ML injection, INJECT 1 ML INTRAMUSCULARLY ONCE A WEEK (Patient not taking: Reported on 01/04/2020), Disp: 12 mL, Rfl: 0  EXAM:  VITALS per patient if applicable: There were no vitals taken for this visit.   GENERAL: alert, oriented, appears well and in no acute  distress  HEENT: atraumatic, conjunttiva clear, no obvious abnormalities on inspection of external nose and ears  NECK: normal movements of the head and neck  LUNGS: on inspection no signs of respiratory distress, breathing rate appears normal, no obvious gross SOB, gasping or wheezing  CV: no obvious cyanosis  MS: moves all visible extremities without noticeable abnormality  PSYCH/NEURO: pleasant and cooperative, no obvious depression or anxiety, speech and thought processing grossly intact  LABS: none today  Lab Results  Component Value Date   TESTOSTERONE 1,029.65 (H) 12/09/2019   Lab Results  Component Value Date   TSH 2.30 12/09/2019   Lab Results  Component Value Date   WBC 8.4 05/04/2019   HGB 15.1 05/04/2019   HCT 46 05/04/2019   MCV 87.4 06/01/2018   PLT 232 05/04/2019   Lab Results  Component Value Date   CREATININE 1.1 05/04/2019   BUN 15 05/04/2019   NA 139 05/04/2019   K 4.2 05/04/2019   CL 100 04/15/2019   CO2 23 04/15/2019   Lab Results  Component Value Date   ALT 45 (A) 01/24/2019   AST 37 01/24/2019   ALKPHOS 38 05/04/2019   BILITOT 0.8 06/01/2018   Lab Results  Component Value Date   CHOL 163 12/09/2019   Lab Results  Component Value Date   HDL 45.70 12/09/2019   Lab Results  Component Value Date   LDLCALC 89 06/10/2017   Lab Results  Component Value Date   TRIG 256.0 (H) 12/09/2019   Lab Results  Component Value Date   CHOLHDL 4 12/09/2019   Lab Results  Component Value Date   PSA 10.82 (H) 12/09/2019   PSA 0.84 06/01/2018   PSA 0.6 03/11/2017    ASSESSMENT AND PLAN:  Discussed the following assessment and plan:  1) Chronic insomnia, at least partially related to chronic stress and anxiety. Ween off clonaz->he has taken 1/2 of his 1mg  tab qhs for about a week. I'll do rx to continue 0.5mg  qhs x 5d, then decrease to 0.25mg  qhs x 6 nights then stop. He can start trazodone 50mg  1-2 qhs when he has gotten to the 0.25mg   dose of clonaz.  2) elevated PSA: back to normal on PSA check at urologist's. Urologist recommended repeat PSA w/reflex free PSA in 6 wks. NOt clear if urologist recommended against any restart of pt's testosterone, but pt will stay off of this for a while (at least until after next PSA check) and we'll revisit in near future.  Will get urology records. When he gets PSA recheck in 6 wks I'll recheck testost level and LH. He may be a candidate for clomiphene for future treatment of hypogonadism if we decide not to restart testosterone.  I discussed the assessment and treatment plan with the patient. The patient was provided an opportunity to ask questions  and all were answered. The patient agreed with the plan and demonstrated an understanding of the instructions.   The patient was advised to call back or seek an in-person evaluation if the symptoms worsen or if the condition fails to improve as anticipated.  F/u: 2-3 mo  Signed:  Crissie Sickles, MD           01/04/2020

## 2020-01-05 MED ORDER — SERTRALINE HCL 50 MG PO TABS: 50.0000 mg | ORAL_TABLET | Freq: Every day | ORAL | 0 refills | Status: DC

## 2020-01-12 ENCOUNTER — Other Ambulatory Visit: Payer: Self-pay

## 2020-01-12 ENCOUNTER — Ambulatory Visit: Payer: Managed Care, Other (non HMO) | Admitting: Family Medicine

## 2020-01-12 ENCOUNTER — Encounter: Payer: Self-pay | Admitting: Family Medicine

## 2020-01-12 DIAGNOSIS — M25371 Other instability, right ankle: Secondary | ICD-10-CM | POA: Diagnosis not present

## 2020-01-12 NOTE — Progress Notes (Signed)
Joshua Mcconnell - 59 y.o. male MRN [id number]  Date of birth: 28-Oct-1960  SUBJECTIVE:  Including CC & ROS.  Chief Complaint  Patient presents with  . Foot Orthotics    Joshua Mcconnell is a 59 y.o. male that is presenting with right ankle instability following a recent surgery.  He has had 4 different operations on the right foot and shortening of the Achilles on the right.  His range of motion is still improving.  He is still wearing a lace up ankle brace.  He has tendencies to supinate his foot.   Review of Systems See HPI   HISTORY: Past Medical, Surgical, Social, and Family History Reviewed & Updated per EMR.   Pertinent Historical Findings include:  Past Medical History:  Diagnosis Date  . Anxiety and depression 2019   Improved with low dose sertraline and clonaz  . Atypical chest pain 01/2019   ordered stress test but denied by insurer  . Chronic fatigue   . DDD (degenerative disc disease), lumbar    L4-5 per pt (did PT at [facility] orth[facility])  . Elevated PSA 12/2019   Recommended urology referral 12/12/19  . Family history of colon cancer    Sister at age 67  . Hx of adenomatous polyp of colon 2012/2015/2020   2012: tubular adenoma.  2015 Benign polyp--09/2013 ([facility]). 01/2019 adenomatous polyp (Dr. Loletha Carrow, [facility]) recall 5 yrs.  . Hyperlipidemia    Great response to statin.  Marland Kitchen Hypogonadism male   . Obesity, Class II, BMI 35-39.9   . Right ankle pain 03/2017   x-ray c/w soft tissue swelling anteriorly--? tendonitis.  Some osteoarthritis changes as well.-->Total ankle arthroplasty 10/2019.    Past Surgical History:  Procedure Laterality Date  . COLONOSCOPY    . COLONOSCOPY W/ POLYPECTOMY  08/2010; 09/2013; 02/04/19   polypectomy (adenomatous) 01/2019->Recall 5 yrs  . CT CORONARY CA SCORING  2020   Score 68.4,nonobst cad, echo 04/2019 normal.  . POLYPECTOMY    . TOTAL ANKLE ARTHROPLASTY Right 10/14/2019  . TOTAL KNEE ARTHROPLASTY Left 05/18/2019   Dr. Tacey Ruiz [facility])  .  TRANSTHORACIC ECHOCARDIOGRAM  04/15/2019   EF 55-60%, nl wall motion.    Family History  Problem Relation Age of Onset  . Diabetes Mother   . Arthritis Mother   . Breast cancer Mother   . Heart disease Father   . Stroke Father   . Hypertension Father   . Colon cancer Sister 44  . Colon polyps Brother   . Arthritis Maternal Grandmother   . Esophageal cancer Neg Hx   . Rectal cancer Neg Hx   . Stomach cancer Neg Hx     Social History   Socioeconomic History  . Marital status: Married    Spouse name: Not on file  . Number of children: Not on file  . Years of education: Not on file  . Highest education level: Not on file  Occupational History  . Not on file  Tobacco Use  . Smoking status: Light Tobacco Smoker    Types: Cigars  . Smokeless tobacco: Never Used  . Tobacco comment: 3 cigars per week  Substance and Sexual Activity  . Alcohol use: Yes    Alcohol/week: 20.0 standard drinks    Types: 10 Cans of beer, 10 Standard drinks or equivalent per week  . Drug use: No  . Sexual activity: Not on file  Other Topics Concern  . Not on file  Social History Narrative   Married,  2 daughters.   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   Alc: 1-2 drinks most days.   Social Determinants of Health   Financial Resource Strain:   . Difficulty of Paying Living Expenses:   Food Insecurity:   . Worried About Charity fundraiser in the Last Year:   . Arboriculturist in the Last Year:   Transportation Needs:   . Film/video editor (Medical):   Marland Kitchen Lack of Transportation (Non-Medical):   Physical Activity:   . Days of Exercise per Week:   . Minutes of Exercise per Session:   Stress:   . Feeling of Stress :   Social Connections:   . Frequency of Communication with Friends and Family:   . Frequency of Social Gatherings with Friends and Family:   . Attends Religious Services:   . Active Member of Clubs or Organizations:     . Attends Archivist Meetings:   Marland Kitchen Marital Status:   Intimate Partner Violence:   . Fear of Current or Ex-Partner:   . Emotionally Abused:   Marland Kitchen Physically Abused:   . Sexually Abused:      PHYSICAL EXAM:  VS: BP (!) 151/89   Pulse 71   Ht 6' (1.829 m)   Wt 259 lb (117.5 kg)   BMI 35.13 kg/m  Physical Exam Gen: NAD, alert, cooperative with exam, well-appearing MSK:  Right foot: Vertical incision over the anterior ankle joint. Limited plantar flexion dorsiflexion. Left foot with pes cavus. Tendency to supinate. Neurovascularly intact  Patient was fitted for a standard, cushioned, semi-rigid orthotic. The orthotic was heated and afterward the patient stood on the orthotic blank positioned on the orthotic stand. The patient was positioned in subtalar neutral position and 10 degrees of ankle dorsiflexion in a weight bearing stance. After completion of molding, a stable base was applied to the orthotic blank. The blank was ground to a stable position for weight bearing. Size: 11 Pairs: 2 Base: Blue EVA Additional Posting and Padding: Took a 7/16" 3/4" heel wedge and converted to a lateral wedge for the right The patient ambulated these, and they were very comfortable.     ASSESSMENT & PLAN:   Ankle instability, right Recent surgery of total ankle replacement.  Has some supination tendencies with ambulation.  Still wearing a lace up ankle brace following his surgery for help with stabilization. -Counseled on home exercise therapy and supportive care. -Orthotics today. -Placed lateral heel wedge on right.  May need to adjust wedge if it wears out over time.

## 2020-01-12 NOTE — Assessment & Plan Note (Signed)
Recent surgery of total ankle replacement.  Has some supination tendencies with ambulation.  Still wearing a lace up ankle brace following his surgery for help with stabilization. -Counseled on home exercise therapy and supportive care. -Orthotics today. -Placed lateral heel wedge on right.  May need to adjust wedge if it wears out over time.

## 2020-01-21 ENCOUNTER — Other Ambulatory Visit: Payer: Self-pay | Admitting: Cardiology

## 2020-01-21 ENCOUNTER — Other Ambulatory Visit: Payer: Self-pay | Admitting: Family Medicine

## 2020-01-24 ENCOUNTER — Other Ambulatory Visit: Payer: Self-pay | Admitting: Family Medicine

## 2020-01-25 ENCOUNTER — Telehealth: Payer: Self-pay

## 2020-01-25 NOTE — Telephone Encounter (Signed)
Patient was sent mychart message advising 90 day supply sent on 5/27 and refill requested too soon. He voiced understading. Will deny this refill and at appropriate time can be refilled.

## 2020-01-25 NOTE — Telephone Encounter (Signed)
Patient refill request   sertraline (ZOLOFT) 50 MG tablet [[id number]   [facility]

## 2020-01-25 NOTE — Telephone Encounter (Signed)
MyChart message read.

## 2020-01-25 NOTE — Telephone Encounter (Signed)
Patient refill request  Sertraline  [facility]

## 2020-01-26 ENCOUNTER — Other Ambulatory Visit: Payer: Self-pay | Admitting: Family Medicine

## 2020-01-27 NOTE — Telephone Encounter (Signed)
Yes #180 is fine.

## 2020-01-27 NOTE — Telephone Encounter (Signed)
Patient requesting 90 day supply of trazadone 50 mg.  Please advise if okay to send in # 180.

## 2020-02-17 ENCOUNTER — Ambulatory Visit: Payer: Managed Care, Other (non HMO)

## 2020-02-20 ENCOUNTER — Encounter: Payer: Self-pay | Admitting: Family Medicine

## 2020-02-23 ENCOUNTER — Ambulatory Visit: Payer: Managed Care, Other (non HMO)

## 2020-02-24 ENCOUNTER — Ambulatory Visit: Payer: Managed Care, Other (non HMO)

## 2020-02-29 ENCOUNTER — Ambulatory Visit (INDEPENDENT_AMBULATORY_CARE_PROVIDER_SITE_OTHER): Payer: Managed Care, Other (non HMO) | Admitting: Family Medicine

## 2020-02-29 ENCOUNTER — Other Ambulatory Visit: Payer: Self-pay

## 2020-02-29 DIAGNOSIS — R972 Elevated prostate specific antigen [PSA]: Secondary | ICD-10-CM

## 2020-02-29 DIAGNOSIS — E291 Testicular hypofunction: Secondary | ICD-10-CM

## 2020-02-29 LAB — TESTOSTERONE: Testosterone: 240.98 ng/dL — ABNORMAL LOW (ref 300.00–890.00)

## 2020-03-01 ENCOUNTER — Encounter: Payer: Self-pay | Admitting: Family Medicine

## 2020-03-01 LAB — LUTEINIZING HORMONE: LH: 3.95 m[IU]/mL (ref 1.50–9.30)

## 2020-03-01 LAB — PSA TOTAL (REFLEX TO FREE): Prostate Specific Ag, Serum: 1.1 ng/mL (ref 0.0–4.0)

## 2020-03-02 ENCOUNTER — Ambulatory Visit: Payer: Managed Care, Other (non HMO)

## 2020-04-22 ENCOUNTER — Other Ambulatory Visit: Payer: Self-pay | Admitting: Cardiology

## 2020-04-23 ENCOUNTER — Other Ambulatory Visit: Payer: Self-pay | Admitting: Family Medicine

## 2020-05-07 ENCOUNTER — Other Ambulatory Visit: Payer: Self-pay | Admitting: Cardiology

## 2020-05-07 ENCOUNTER — Other Ambulatory Visit: Payer: Self-pay | Admitting: Family Medicine

## 2020-06-07 ENCOUNTER — Other Ambulatory Visit: Payer: Self-pay

## 2020-06-08 ENCOUNTER — Encounter: Payer: Self-pay | Admitting: Family Medicine

## 2020-06-08 ENCOUNTER — Other Ambulatory Visit: Payer: Self-pay

## 2020-06-08 ENCOUNTER — Ambulatory Visit: Payer: Managed Care, Other (non HMO) | Admitting: Family Medicine

## 2020-06-08 VITALS — BP 120/70 | HR 68 | Temp 98.0°F | Resp 16 | Ht 72.0 in | Wt 274.8 lb

## 2020-06-08 DIAGNOSIS — E7800 Pure hypercholesterolemia, unspecified: Secondary | ICD-10-CM

## 2020-06-08 DIAGNOSIS — E669 Obesity, unspecified: Secondary | ICD-10-CM | POA: Diagnosis not present

## 2020-06-08 DIAGNOSIS — Z8659 Personal history of other mental and behavioral disorders: Secondary | ICD-10-CM

## 2020-06-08 DIAGNOSIS — J9801 Acute bronchospasm: Secondary | ICD-10-CM | POA: Diagnosis not present

## 2020-06-08 DIAGNOSIS — R059 Cough, unspecified: Secondary | ICD-10-CM | POA: Diagnosis not present

## 2020-06-08 MED ORDER — ALBUTEROL SULFATE HFA 108 (90 BASE) MCG/ACT IN AERS: 2.0000 | INHALATION_SPRAY | RESPIRATORY_TRACT | 0 refills | Status: DC | PRN

## 2020-06-08 NOTE — Progress Notes (Signed)
OFFICE VISIT  06/08/2020  CC:  Chief Complaint  Patient presents with  . Follow-up    RCI, pt is not fasting   HPI:    Patient is a 59 y.o. Caucasian male who presents for 6 mo f/u HLD, class II obesity, and hx of anxiety and depression. Hx of elevated PSA this year->urol eval->stopped testost->recheck psa very good/normal-->improved/lower even more on subsequent repeat a couple months later.  INTERIM HX: Says he had covid 10/2019. States has cough since that time, "constant random coughing".   Some tickle in his throat. Cough is dry. Occ so bad he almost vomits/gags. No fevers, SOB, or CP.  Working out/exercising well but this doesn't trigger his sx's. No hx of asthmatic sx's in the past. No signif nasal sx's occurring with any consistency. Cough feels exactly like it did when he had covid. No cough meds used.  He has gotten off all his anx/dep meds. Feels clearer/better since he's done this. Has some nervous eating, though, has gained some wt (up 14 lbs in last 6 mo).  HLD: tolerating statin (atorva 80mg  qd).  Taking trazodone 50-100 mg qhs prn insomnia.  ROS: as per HPI, plus->no dizziness, no HAs, no rashes, no melena/hematochezia.  No polyuria or polydipsia.  No myalgias or arthralgias.  No focal weakness, paresthesias, or tremors.  No acute vision or hearing abnormalities. No n/v/d or abd pain.  No palpitations.      Past Medical History:  Diagnosis Date  . Anxiety and depression 2019   Improved with low dose sertraline and clonaz  . Atypical chest pain 01/2019   ordered stress test but denied by insurer  . Chronic fatigue   . DDD (degenerative disc disease), lumbar    L4-5 per pt (did PT at [facility])  . Elevated PSA 12/2019   Recommended urology referral 12/12/19  . Family history of colon cancer    Sister at age 18  . Hx of adenomatous polyp of colon 2012/2015/2020   2012: tubular adenoma.  2015 Benign polyp--09/2013 ([facility]). 01/2019 adenomatous  polyp (Dr. Loletha Carrow, [facility]) recall 5 yrs.  . Hyperlipidemia    Great response to statin.  Marland Kitchen Hypogonadism male   . Obesity, Class II, BMI 35-39.9   . Right ankle pain 03/2017   x-ray c/w soft tissue swelling anteriorly--? tendonitis.  Some osteoarthritis changes as well.-->Total ankle arthroplasty 10/2019.    Past Surgical History:  Procedure Laterality Date  . COLONOSCOPY    . COLONOSCOPY W/ POLYPECTOMY  08/2010; 09/2013; 02/04/19   polypectomy (adenomatous) 01/2019->Recall 5 yrs  . CT CORONARY CA SCORING  2020   Score 68.4,nonobst cad, echo 04/2019 normal.  . POLYPECTOMY    . TOTAL ANKLE ARTHROPLASTY Right 10/14/2019  . TOTAL KNEE ARTHROPLASTY Left 05/18/2019   Dr. Tacey Ruiz [facility])  . TRANSTHORACIC ECHOCARDIOGRAM  04/15/2019   EF 55-60%, nl wall motion.    Outpatient Medications Prior to Visit  Medication Sig Dispense Refill  . atorvastatin (LIPITOR) 80 MG tablet Take 1 tablet (80 mg total) by mouth daily. 90 tablet 0  . ketoconazole (NIZORAL) 2 % cream Apply topically 2 (two) times daily.    . traZODone (DESYREL) 50 MG tablet TAKE 1-2 TABS BY MOUTH AT BEDTIME FOR INSOMNIA 60 tablet 0   No facility-administered medications prior to visit.    No Known Allergies  ROS As per HPI  PE: Vitals with BMI 06/08/2020 01/12/2020 12/02/2019  Height 6\' 0"  6\' 0"  6\' 0"   Weight 274 lbs 13  oz 259 lbs 259 lbs 3 oz  BMI 37.26 73.40 37.09  Systolic 643 838 184  Diastolic 70 89 86  Pulse 68 71 76     Gen: Alert, well appearing.  Patient is oriented to person, place, time, and situation. AFFECT: pleasant, lucid thought and speech. CRF:VOHK: no injection, icteris, swelling, or exudate.  EOMI, PERRLA. Mouth: lips without lesion/swelling.  Oral mucosa pink and moist. Oropharynx without erythema, exudate, or swelling.  CV: RRR, no m/r/g.   LUNGS: CTA bilat, nonlabored resps, good aeration in all lung fields. EXT: no clubbing or cyanosis.  no edema.    LABS:  Lab Results  Component  Value Date   TSH 2.30 12/09/2019   Lab Results  Component Value Date   WBC 8.4 05/04/2019   HGB 15.1 05/04/2019   HCT 46 05/04/2019   MCV 87.4 06/01/2018   PLT 232 05/04/2019   Lab Results  Component Value Date   CREATININE 1.1 05/04/2019   BUN 15 05/04/2019   NA 139 05/04/2019   K 4.2 05/04/2019   CL 100 04/15/2019   CO2 23 04/15/2019   Lab Results  Component Value Date   ALT 45 (A) 01/24/2019   AST 37 01/24/2019   ALKPHOS 38 05/04/2019   BILITOT 0.8 06/01/2018   Lab Results  Component Value Date   CHOL 163 12/09/2019   Lab Results  Component Value Date   HDL 45.70 12/09/2019   Lab Results  Component Value Date   LDLCALC 89 06/10/2017   Lab Results  Component Value Date   TRIG 256.0 (H) 12/09/2019   Lab Results  Component Value Date   CHOLHDL 4 12/09/2019   Lab Results  Component Value Date   PSA 10.82 (H) 12/09/2019   PSA 0.84 06/01/2018   PSA 0.6 03/11/2017  PSA 02/29/20= 1.1  IMPRESSION AND PLAN:  1) HLD, class II obesity, hx of dep/anx, hx of secondary hypogonadism. Unfortunately wt is up and he's doing some "nervous eating".  Feels better OFF sertraline, though.  Cont efforts at [facility] for help with wt, mood and anxiety, libido, energy level, etc. I agree with his decision to NOT restart any antidepressant/antianxiety med or testosterone supplement at this time. OK to defer labs at this time--we'll do these at f/u for CPE in 6 mo.  2) Cough: consistent with postviral (covid) hypersensitivity/bronchospasm--will do trial of albuterol hfa 2 p q4h prn. Therapeutic expectations and side effect profile of medication discussed today.  Patient's questions answered.  An After Visit Summary was printed and given to the patient.  FOLLOW UP: Return in about 6 months (around 12/07/2020) for annual CPE (fasting).  Signed:  Crissie Sickles, MD           06/08/2020

## 2020-11-19 ENCOUNTER — Ambulatory Visit (INDEPENDENT_AMBULATORY_CARE_PROVIDER_SITE_OTHER): Payer: Managed Care, Other (non HMO) | Admitting: Otolaryngology

## 2020-11-22 ENCOUNTER — Ambulatory Visit (INDEPENDENT_AMBULATORY_CARE_PROVIDER_SITE_OTHER): Payer: Managed Care, Other (non HMO) | Admitting: Otolaryngology

## 2020-11-22 ENCOUNTER — Encounter (INDEPENDENT_AMBULATORY_CARE_PROVIDER_SITE_OTHER): Payer: Self-pay | Admitting: Otolaryngology

## 2020-11-22 ENCOUNTER — Other Ambulatory Visit: Payer: Self-pay

## 2020-11-22 VITALS — Temp 94.8°F

## 2020-11-22 DIAGNOSIS — G4733 Obstructive sleep apnea (adult) (pediatric): Secondary | ICD-10-CM | POA: Diagnosis not present

## 2020-11-22 DIAGNOSIS — R0683 Snoring: Secondary | ICD-10-CM
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
## 2020-11-23 ENCOUNTER — Other Ambulatory Visit (INDEPENDENT_AMBULATORY_CARE_PROVIDER_SITE_OTHER): Payer: Self-pay

## 2020-11-23 DIAGNOSIS — G4733 Obstructive sleep apnea (adult) (pediatric): Secondary | ICD-10-CM

## 2020-11-23 NOTE — Progress Notes (Signed)
slep

## 2020-12-05 ENCOUNTER — Other Ambulatory Visit (INDEPENDENT_AMBULATORY_CARE_PROVIDER_SITE_OTHER): Payer: Self-pay

## 2020-12-05 DIAGNOSIS — G4733 Obstructive sleep apnea (adult) (pediatric): Secondary | ICD-10-CM

## 2020-12-09 HISTORY — PX: CPAP: SHX449

## 2020-12-10 ENCOUNTER — Telehealth (INDEPENDENT_AMBULATORY_CARE_PROVIDER_SITE_OTHER): Payer: Self-pay

## 2020-12-10 NOTE — Telephone Encounter (Signed)
Pt had calle end of last week to follow up with his sleep study. The sleep study center called me today to let me know that they are about 2 weeks behind due to short staff. I did call and inform pt.

## 2020-12-21 ENCOUNTER — Encounter: Payer: Managed Care, Other (non HMO) | Admitting: Family Medicine

## 2020-12-28 ENCOUNTER — Ambulatory Visit: Payer: Managed Care, Other (non HMO) | Admitting: Family Medicine

## 2021-01-03 ENCOUNTER — Other Ambulatory Visit: Payer: Self-pay

## 2021-01-04 ENCOUNTER — Other Ambulatory Visit: Payer: Self-pay

## 2021-01-04 ENCOUNTER — Encounter: Payer: Self-pay | Admitting: Family Medicine

## 2021-01-04 ENCOUNTER — Ambulatory Visit (INDEPENDENT_AMBULATORY_CARE_PROVIDER_SITE_OTHER): Payer: Managed Care, Other (non HMO) | Admitting: Family Medicine

## 2021-01-04 VITALS — BP 113/79 | HR 71 | Temp 97.9°F | Resp 16 | Ht 71.0 in | Wt 266.6 lb

## 2021-01-04 DIAGNOSIS — Z87898 Personal history of other specified conditions: Secondary | ICD-10-CM | POA: Diagnosis not present

## 2021-01-04 DIAGNOSIS — E7800 Pure hypercholesterolemia, unspecified: Secondary | ICD-10-CM | POA: Diagnosis not present

## 2021-01-04 DIAGNOSIS — Z125 Encounter for screening for malignant neoplasm of prostate: Secondary | ICD-10-CM

## 2021-01-04 DIAGNOSIS — Z0000 Encounter for general adult medical examination without abnormal findings: Secondary | ICD-10-CM | POA: Diagnosis not present

## 2021-01-04 DIAGNOSIS — E291 Testicular hypofunction: Secondary | ICD-10-CM

## 2021-01-04 NOTE — Progress Notes (Signed)
OFFICE VISIT  01/04/2021  CC:  Chief Complaint  Patient presents with  . Annual Exam    Pt is not fasting     HPI:    Patient is a 60 y.o. Caucasian male who presents for annual health maintenance exam and 7 mo f/u HLD and obesity. He has hx of anxiety and depression, took himself off his SSRI last year and felt improved. A/P as of last visit: "1) HLD, class II obesity, hx of dep/anx, hx of secondary hypogonadism. Unfortunately wt is up and he's doing some "nervous eating".  Feels better OFF sertraline, though.  Cont efforts at [facility] for help with wt, mood and anxiety, libido, energy level, etc. I agree with his decision to NOT restart any antidepressant/antianxiety med or testosterone supplement at this time. OK to defer labs at this time--we'll do these at f/u for CPE in 6 mo.  2) Cough: consistent with postviral (covid) hypersensitivity/bronchospasm--will do trial of albuterol hfa 2 p q4h prn. Therapeutic expectations and side effect profile of medication discussed today.  Patient's questions answered."  INTERIM HX: Feeling well. Got himself off all meds (testost, sertraline, atorva). He got sick of pills--all this was around the time he was also having to take pain meds post surgery. He feels much better being off of medication and would love to continue that way.  HLD: was on atorvastatin 80mg  qd.  Stopped as per above.  Of note, got eval by Dr. Lucia Gaskins in ENT 11/22/20 for suspicion of OSA, sleep study is planned but pt waiting for insurance approval.  Says he still has dry cough periodically since having covid over a year ago.  Feels tickle in throat and then has to cough a lot for a few minutes and drink some water and then he's fine.  RAndom. No wheezing, chest tightness, hemoptysis, sob, or cp. No nasal congestion/runny nose, or PND. Doesn't feel heartburn.  Past Medical History:  Diagnosis Date  . Anxiety and depression 2019   Improved with low dose sertraline and  clonaz  . Atypical chest pain 01/2019   ordered stress test but denied by insurer  . Chronic fatigue   . DDD (degenerative disc disease), lumbar    L4-5 per pt (did PT at [facility])  . Elevated PSA 12/2019   Recommended urology referral 12/12/19  . Family history of colon cancer    Sister at age 34  . Hx of adenomatous polyp of colon 2012/2015/2020   2012: tubular adenoma.  2015 Benign polyp--09/2013 ([facility]). 01/2019 adenomatous polyp (Dr. Loletha Carrow, [facility]) recall 5 yrs.  . Hyperlipidemia    Great response to statin.  Marland Kitchen Hypogonadism male   . Obesity, Class II, BMI 35-39.9   . Right ankle pain 03/2017   x-ray c/w soft tissue swelling anteriorly--? tendonitis.  Some osteoarthritis changes as well.-->Total ankle arthroplasty 10/2019.    Past Surgical History:  Procedure Laterality Date  . COLONOSCOPY    . COLONOSCOPY W/ POLYPECTOMY  08/2010; 09/2013; 02/04/19   polypectomy (adenomatous) 01/2019->Recall 5 yrs  . CT CORONARY CA SCORING  2020   Score 68.4,nonobst cad, echo 04/2019 normal.  . POLYPECTOMY    . TOTAL ANKLE ARTHROPLASTY Right 10/14/2019  . TOTAL KNEE ARTHROPLASTY Left 05/18/2019   Dr. Tacey Ruiz [facility])  . TRANSTHORACIC ECHOCARDIOGRAM  04/15/2019   EF 55-60%, nl wall motion.   Family History  Problem Relation Age of Onset  . Diabetes Mother   . Arthritis Mother   . Breast cancer Mother   .  Heart disease Father   . Stroke Father   . Hypertension Father   . Colon cancer Sister 65  . Colon polyps Brother   . Arthritis Maternal Grandmother   . Esophageal cancer Neg Hx   . Rectal cancer Neg Hx   . Stomach cancer Neg Hx    Social History   Socioeconomic History  . Marital status: Married    Spouse name: Not on file  . Number of children: Not on file  . Years of education: Not on file  . Highest education level: Not on file  Occupational History  . Not on file  Tobacco Use  . Smoking status: Former Smoker    Years: 2.00    Types: Cigars  . Smokeless  tobacco: Never Used  . Tobacco comment: 3 cigars per week  Vaping Use  . Vaping Use: Never used  Substance and Sexual Activity  . Alcohol use: Yes    Alcohol/week: 20.0 standard drinks    Types: 10 Cans of beer, 10 Standard drinks or equivalent per week  . Drug use: No  . Sexual activity: Not on file  Other Topics Concern  . Not on file  Social History Narrative   Married, 2 daughters.   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   Alc: 1-2 drinks most days.   Social Determinants of Health   Financial Resource Strain: Not on file  Food Insecurity: Not on file  Transportation Needs: Not on file  Physical Activity: Not on file  Stress: Not on file  Social Connections: Not on file    Outpatient Medications Prior to Visit  Medication Sig Dispense Refill  . albuterol (VENTOLIN HFA) 108 (90 Base) MCG/ACT inhaler Inhale 2 puffs into the lungs every 4 (four) hours as needed for wheezing or shortness of breath. (Patient not taking: Reported on 01/04/2021) 1 each 0  . atorvastatin (LIPITOR) 80 MG tablet Take 1 tablet (80 mg total) by mouth daily. (Patient not taking: Reported on 01/04/2021) 90 tablet 0  . ketoconazole (NIZORAL) 2 % cream Apply topically 2 (two) times daily. (Patient not taking: Reported on 01/04/2021)     No facility-administered medications prior to visit.    No Known Allergies  ROS Review of Systems  Constitutional: Negative for appetite change, chills, fatigue and fever.  HENT: Negative for congestion, dental problem, ear pain and sore throat.   Eyes: Negative for discharge, redness and visual disturbance.  Respiratory: Positive for cough (chronic since having covid last year). Negative for chest tightness, shortness of breath and wheezing.   Cardiovascular: Negative for chest pain, palpitations and leg swelling.  Gastrointestinal: Negative for abdominal pain, blood in stool, diarrhea, nausea and vomiting.   Genitourinary: Negative for difficulty urinating, dysuria, flank pain, frequency, hematuria and urgency.  Musculoskeletal: Negative for arthralgias, back pain, joint swelling, myalgias and neck stiffness.  Skin: Negative for pallor and rash.  Neurological: Negative for dizziness, speech difficulty, weakness and headaches.  Hematological: Negative for adenopathy. Does not bruise/bleed easily.  Psychiatric/Behavioral: Negative for confusion and sleep disturbance. The patient is not nervous/anxious.      PE: Vitals with BMI 01/04/2021 06/08/2020 01/12/2020  Height 5\' 11"  6\' 0"  6\' 0"   Weight 266 lbs 10 oz 274 lbs 13 oz 259 lbs  BMI 37.2 82.95 62.13  Systolic 086 578 469  Diastolic 79 70 89  Pulse 71 68 71   Gen: Alert, well appearing.  Patient is oriented  to person, place, time, and situation. AFFECT: pleasant, lucid thought and speech. ENT: Ears: EACs clear, normal epithelium.  TMs with good light reflex and landmarks bilaterally.  Eyes: no injection, icteris, swelling, or exudate.  EOMI, PERRLA. Nose: no drainage or turbinate edema/swelling.  No injection or focal lesion.  Mouth: lips without lesion/swelling.  Oral mucosa pink and moist.  Dentition intact and without obvious caries or gingival swelling.  Oropharynx without erythema, exudate, or swelling.  Neck: supple/nontender.  No LAD, mass, or TM.  Carotid pulses 2+ bilaterally, without bruits. CV: RRR, no m/r/g.   LUNGS: CTA bilat, nonlabored resps, good aeration in all lung fields. ABD: soft, NT, ND, BS normal.  No hepatospenomegaly or mass.  No bruits. EXT: no clubbing, cyanosis, or edema.  Musculoskeletal: no joint swelling, erythema, warmth, or tenderness.  ROM of all joints intact. Skin - no sores or suspicious lesions or rashes or color changes    LABS:  Lab Results  Component Value Date   TSH 2.30 12/09/2019   Lab Results  Component Value Date   WBC 8.4 05/04/2019   HGB 15.1 05/04/2019   HCT 46 05/04/2019   MCV 87.4  06/01/2018   PLT 232 05/04/2019   Lab Results  Component Value Date   CREATININE 1.1 05/04/2019   BUN 15 05/04/2019   NA 139 05/04/2019   K 4.2 05/04/2019   CL 100 04/15/2019   CO2 23 04/15/2019   Lab Results  Component Value Date   ALT 45 (A) 01/24/2019   AST 37 01/24/2019   ALKPHOS 38 05/04/2019   BILITOT 0.8 06/01/2018   Lab Results  Component Value Date   CHOL 163 12/09/2019   Lab Results  Component Value Date   HDL 45.70 12/09/2019   Lab Results  Component Value Date   LDLCALC 89 06/10/2017   Lab Results  Component Value Date   TRIG 256.0 (H) 12/09/2019   Lab Results  Component Value Date   CHOLHDL 4 12/09/2019   Lab Results  Component Value Date   PSA 10.82 (H) 12/09/2019   PSA 0.84 06/01/2018   PSA 0.6 03/11/2017   Lab Results  Component Value Date   TESTOSTERONE 240.98 (L) 02/29/2020   IMPRESSION AND PLAN:  1) HLD: he chose to stop atorvastatin and is hopeful that his wt loss has helped his cholesterol FLP and hepatic panel ordered--future when fasting. He is willing to go back on statin if lipids significantly elevated still.  2) Hypogonadism, he stopped his testosterone replacement. He is interested in seeing what level is since being off this for 1/2 year or so.  3) Chronic cough: sounds like upper airway cough syndrome. Pt will monitor for changes and let me know.  4) Health maintenance exam: Reviewed age and gender appropriate health maintenance issues (prudent diet, regular exercise, health risks of tobacco and excessive alcohol, use of seatbelts, fire alarms in home, use of sunscreen).  Also reviewed age and gender appropriate health screening as well as vaccine recommendations. Vaccines: ALL UTD including shingrix. Labs: cbc, cmet, flp, tsh, psa--future when fasting. Prostate ca screening: hx of elevated psa 2021->ref to urol-> repeat at urologist's was normal.  Repeat PSA --future ordered. Colon ca screening: rpt colonoscopy due  2025.  An After Visit Summary was printed and given to the patient.  FOLLOW UP: Return in about 1 year (around 01/04/2022) for annual CPE (fasting).  Signed:  Crissie Sickles, MD           01/04/2021

## 2021-01-04 NOTE — Patient Instructions (Signed)

## 2021-01-06 ENCOUNTER — Encounter: Payer: Self-pay | Admitting: Family Medicine

## 2021-01-08 ENCOUNTER — Telehealth (INDEPENDENT_AMBULATORY_CARE_PROVIDER_SITE_OTHER): Payer: Self-pay | Admitting: Otolaryngology

## 2021-01-08 ENCOUNTER — Telehealth (INDEPENDENT_AMBULATORY_CARE_PROVIDER_SITE_OTHER): Payer: Self-pay

## 2021-01-08 NOTE — Telephone Encounter (Signed)
Patient called in to follow up on his Sleep Study. He states his insurance wants information concerning the study. Patient is requesting a call back to discuss.  Joshua Mcconnell

## 2021-01-08 NOTE — Telephone Encounter (Signed)
Pt called in that the sleep study center sent him a letter stating they need insurance info. I pulled up pt insurance card and it was correct. I called the sleep study center and they stated insurance denied due to not enough medical evidence. I left pt a message on this and I will check with [facility].

## 2021-01-11 ENCOUNTER — Other Ambulatory Visit: Payer: Self-pay

## 2021-01-11 ENCOUNTER — Ambulatory Visit (INDEPENDENT_AMBULATORY_CARE_PROVIDER_SITE_OTHER): Payer: Managed Care, Other (non HMO)

## 2021-01-11 DIAGNOSIS — Z87898 Personal history of other specified conditions: Secondary | ICD-10-CM

## 2021-01-11 DIAGNOSIS — Z125 Encounter for screening for malignant neoplasm of prostate: Secondary | ICD-10-CM

## 2021-01-11 DIAGNOSIS — E782 Mixed hyperlipidemia: Secondary | ICD-10-CM

## 2021-01-11 DIAGNOSIS — E291 Testicular hypofunction: Secondary | ICD-10-CM

## 2021-01-11 DIAGNOSIS — E7800 Pure hypercholesterolemia, unspecified: Secondary | ICD-10-CM

## 2021-01-11 LAB — CBC WITH DIFFERENTIAL/PLATELET
Basophils Absolute: 0.1 10*3/uL (ref 0.0–0.1)
Basophils Relative: 1.2 % (ref 0.0–3.0)
Eosinophils Absolute: 0.2 10*3/uL (ref 0.0–0.7)
Eosinophils Relative: 3 % (ref 0.0–5.0)
HCT: 42.8 % (ref 39.0–52.0)
Hemoglobin: 14.7 g/dL (ref 13.0–17.0)
Lymphocytes Relative: 40.4 % (ref 12.0–46.0)
Lymphs Abs: 2.1 10*3/uL (ref 0.7–4.0)
MCHC: 34.3 g/dL (ref 30.0–36.0)
MCV: 87.2 fl (ref 78.0–100.0)
Monocytes Absolute: 0.4 10*3/uL (ref 0.1–1.0)
Monocytes Relative: 8.2 % (ref 3.0–12.0)
Neutro Abs: 2.5 10*3/uL (ref 1.4–7.7)
Neutrophils Relative %: 47.2 % (ref 43.0–77.0)
Platelets: 247 10*3/uL (ref 150.0–400.0)
RBC: 4.91 Mil/uL (ref 4.22–5.81)
RDW: 13.9 % (ref 11.5–15.5)
WBC: 5.2 10*3/uL (ref 4.0–10.5)

## 2021-01-11 LAB — TSH: TSH: 2.47 u[IU]/mL (ref 0.35–4.50)

## 2021-01-11 LAB — PSA: PSA: 1.06 ng/mL (ref 0.10–4.00)

## 2021-01-11 LAB — TESTOSTERONE: Testosterone: 303.09 ng/dL (ref 300.00–890.00)

## 2021-01-14 LAB — LIPID PANEL
Cholesterol: 275 mg/dL — ABNORMAL HIGH (ref 0–200)
HDL: 54.9 mg/dL (ref >39.00)
NonHDL: 220.23
Total CHOL/HDL Ratio: 5
Triglycerides: 205 mg/dL — ABNORMAL HIGH (ref 0.0–149.0)
VLDL: 41 mg/dL — ABNORMAL HIGH (ref 0.0–40.0)

## 2021-01-14 LAB — COMPREHENSIVE METABOLIC PANEL
ALT: 27 U/L (ref 0–53)
AST: 25 U/L (ref 0–37)
Albumin: 4.4 g/dL (ref 3.5–5.2)
Alkaline Phosphatase: 57 U/L (ref 39–117)
BUN: 18 mg/dL (ref 6–23)
CO2: 25 mEq/L (ref 19–32)
Calcium: 9.6 mg/dL (ref 8.4–10.5)
Chloride: 102 mEq/L (ref 96–112)
Creatinine, Ser: 1.06 mg/dL (ref 0.40–1.50)
GFR: 77.86 mL/min (ref >60.00)
Glucose, Bld: 100 mg/dL — ABNORMAL HIGH (ref 70–99)
Potassium: 4.5 mEq/L (ref 3.5–5.1)
Sodium: 140 mEq/L (ref 135–145)
Total Bilirubin: 0.8 mg/dL (ref 0.2–1.2)
Total Protein: 7 g/dL (ref 6.0–8.3)

## 2021-01-14 LAB — LDL CHOLESTEROL, DIRECT: Direct LDL: 181 mg/dL

## 2021-01-15 ENCOUNTER — Telehealth (INDEPENDENT_AMBULATORY_CARE_PROVIDER_SITE_OTHER): Payer: Self-pay

## 2021-01-15 MED ORDER — ATORVASTATIN CALCIUM 80 MG PO TABS: 80.0000 mg | ORAL_TABLET | Freq: Every day | ORAL | 2 refills | Status: DC

## 2021-01-15 NOTE — Telephone Encounter (Signed)
Joshua Mcconnell had been denied for [facility] Sleep Study, they said due to not enough medical info for it to be a  Necessity. I am sending sleep study to [facility] study, since it seems pt should have more than enough medical need. Pt has been informed.

## 2021-01-15 NOTE — Addendum Note (Signed)
Addended by: Octaviano Glow on: 01/15/2021 08:49 AM   Modules accepted: Orders

## 2021-01-15 NOTE — Addendum Note (Signed)
Addended by: Octaviano Glow on: 01/15/2021 08:47 AM   Modules accepted: Orders

## 2021-03-27 ENCOUNTER — Telehealth (INDEPENDENT_AMBULATORY_CARE_PROVIDER_SITE_OTHER): Payer: Self-pay | Admitting: Otolaryngology

## 2021-03-27 NOTE — Telephone Encounter (Signed)
Called Joshua Mcconnell concerning results of his sleep study which demonstrated severe obstructive sleep apnea with O2 sat below 88% of the time for 25% of the sleep or 89 minutes with the lowest O2 sat dropping down to 60%. Discussed with him concerning use of nasal CPAP and we will call and arrange this for him. He will contact our office next week if he has not heard from the [facility].

## 2021-04-14 ENCOUNTER — Other Ambulatory Visit: Payer: Self-pay | Admitting: Family Medicine

## 2021-04-19 ENCOUNTER — Other Ambulatory Visit: Payer: Self-pay

## 2021-04-19 ENCOUNTER — Ambulatory Visit (INDEPENDENT_AMBULATORY_CARE_PROVIDER_SITE_OTHER): Payer: 59

## 2021-04-19 DIAGNOSIS — E782 Mixed hyperlipidemia: Secondary | ICD-10-CM

## 2021-04-19 LAB — LIPID PANEL
Cholesterol: 139 mg/dL (ref 0–200)
HDL: 51.8 mg/dL (ref >39.00)
LDL Cholesterol: 63 mg/dL (ref 0–99)
NonHDL: 86.85
Total CHOL/HDL Ratio: 3
Triglycerides: 119 mg/dL (ref 0.0–149.0)
VLDL: 23.8 mg/dL (ref 0.0–40.0)

## 2021-04-19 LAB — ALT: ALT: 33 U/L (ref 0–53)

## 2021-04-19 LAB — AST: AST: 26 U/L (ref 0–37)

## 2021-04-22 ENCOUNTER — Telehealth: Payer: Self-pay

## 2021-04-22 MED ORDER — ATORVASTATIN CALCIUM 80 MG PO TABS: 80.0000 mg | ORAL_TABLET | Freq: Every day | ORAL | 3 refills | Status: DC

## 2021-04-22 NOTE — Telephone Encounter (Signed)
-----   Message from Tammi Sou, MD sent at 04/19/2021  5:10 PM EDT ----- Cholesterol numbers are now excellent. Continue atorvastatin '80mg'$  daily.  Pls do 90d rx, RF x 3.

## 2021-04-22 NOTE — Telephone Encounter (Signed)
LM for pt to return call regarding results or check MyChart.

## 2021-04-22 NOTE — Telephone Encounter (Signed)
Rx sent in 1 year supply.

## 2021-04-22 NOTE — Telephone Encounter (Signed)
Patient is returning a call from [place].

## 2021-04-22 NOTE — Telephone Encounter (Signed)
MyChart message read.

## 2021-07-11 DIAGNOSIS — R7303 Prediabetes: Secondary | ICD-10-CM

## 2021-07-11 HISTORY — DX: Prediabetes: R73.03

## 2021-07-23 ENCOUNTER — Other Ambulatory Visit: Payer: Self-pay

## 2021-07-24 ENCOUNTER — Other Ambulatory Visit: Payer: Self-pay | Admitting: Family Medicine

## 2021-07-24 ENCOUNTER — Encounter: Payer: Self-pay | Admitting: Family Medicine

## 2021-07-24 ENCOUNTER — Other Ambulatory Visit: Payer: Self-pay

## 2021-07-24 ENCOUNTER — Ambulatory Visit: Payer: 59 | Admitting: Family Medicine

## 2021-07-24 VITALS — BP 115/74 | HR 73 | Temp 98.4°F | Ht 71.0 in | Wt 256.8 lb

## 2021-07-24 DIAGNOSIS — E669 Obesity, unspecified: Secondary | ICD-10-CM

## 2021-07-24 DIAGNOSIS — F331 Major depressive disorder, recurrent, moderate: Secondary | ICD-10-CM

## 2021-07-24 DIAGNOSIS — E7800 Pure hypercholesterolemia, unspecified: Secondary | ICD-10-CM

## 2021-07-24 DIAGNOSIS — K429 Umbilical hernia without obstruction or gangrene: Secondary | ICD-10-CM

## 2021-07-24 MED ORDER — OZEMPIC (0.25 OR 0.5 MG/DOSE) 2 MG/1.5ML ~~LOC~~ SOPN: 0.5000 mg | PEN_INJECTOR | SUBCUTANEOUS | 1 refills | Status: DC

## 2021-07-24 MED ORDER — BUPROPION HCL ER (XL) 150 MG PO TB24: 150.0000 mg | ORAL_TABLET | Freq: Every day | ORAL | 1 refills | Status: DC

## 2021-07-24 NOTE — Progress Notes (Signed)
OFFICE VISIT  07/24/2021  CC:  Chief Complaint  Patient presents with   Depression    Got worse over the last 1-2 months.    Belly button    Getting large, 2 weeks ago. Denies pain or irritation.     HPI:    Patient is a 60 y.o. male who presents for depression and belly button enlargement. I last saw him 01/04/21. A/P as of that visit: "1) HLD: he chose to stop atorvastatin and is hopeful that his wt loss has helped his cholesterol FLP and hepatic panel ordered--future when fasting. He is willing to go back on statin if lipids significantly elevated still.   2) Hypogonadism, he stopped his testosterone replacement. He is interested in seeing what level is since being off this for 1/2 year or so.   3) Chronic cough: sounds like upper airway cough syndrome. Pt will monitor for changes and let me know.   4) Health maintenance exam: Reviewed age and gender appropriate health maintenance issues (prudent diet, regular exercise, health risks of tobacco and excessive alcohol, use of seatbelts, fire alarms in home, use of sunscreen).  Also reviewed age and gender appropriate health screening as well as vaccine recommendations. Vaccines: ALL UTD including shingrix. Labs: cbc, cmet, flp, tsh, psa--future when fasting. Prostate ca screening: hx of elevated psa 2021->ref to urol-> repeat at urologist's was normal.  Repeat PSA --future ordered. Colon ca screening: rpt colonoscopy due 2025."  INTERIM HX: Suliman has been struggling again with significant depressed mood.  Several things going on in life have contributed to this--significant problems with his wife, currently separated, business problems related to [employer] pandemic issues. Feels like crying all the time.  Some anhedonia.  Feels like he is let his family down. Feels like sometimes he would be better off dead but no thoughts of hurting himself or others.  He has no libido and does have erectile dysfunction.   Has a history of low  testosterone.    He does state he has a lot of anxiety but nothing like panic.  Sleep is not very good.  Appetite is good.  On a good note he has been eating better though as well as working out regularly with a Clinical research associate.  He feels like he would be happier if he felt good about his body.  Recently when working out he noticed a bulge in his bellybutton area.  He does not hurt but is a little uncomfortable to push on.  ROS as above, plus--> no fevers, no CP, no SOB, no wheezing, no cough, no dizziness, no HAs, no rashes, no melena/hematochezia.  No polyuria or polydipsia.  No myalgias or arthralgias.  No focal weakness, paresthesias, or tremors.  No acute vision or hearing abnormalities.  No dysuria or unusual/new urinary urgency or frequency.  No recent changes in lower legs. No n/v/d or abd pain.  No palpitations.     Past Medical History:  Diagnosis Date   Anxiety and depression 2019   Improved with low dose sertraline and clonaz   Atypical chest pain 01/2019   ordered stress test but denied by insurer   Chronic fatigue    COVID-19 virus infection 10/2019   DDD (degenerative disc disease), lumbar    L4-5 per pt (did PT at [facility])   Elevated PSA 12/2019   Recommended urology referral 12/12/19   Family history of colon cancer    Sister at age 31   Hx of adenomatous polyp of colon 2012/2015/2020  2012: tubular adenoma.  2015 Benign polyp--09/2013 ([facility]). 01/2019 adenomatous polyp (Dr. Loletha Carrow, [facility]) recall 5 yrs.   Hyperlipidemia    Great response to statin.   Hypogonadism male    Obesity, Class II, BMI 35-39.9    Right ankle pain 03/2017   x-ray c/w soft tissue swelling anteriorly--? tendonitis.  Some osteoarthritis changes as well.-->Total ankle arthroplasty 10/2019.    Past Surgical History:  Procedure Laterality Date   COLONOSCOPY     COLONOSCOPY W/ POLYPECTOMY  08/2010; 09/2013; 02/04/19   polypectomy (adenomatous) 01/2019->Recall 5 yrs   CPAP  12/2020   CT  CORONARY CA SCORING  2020   Score 68.4,nonobst cad, echo 04/2019 normal.   POLYPECTOMY     TOTAL ANKLE ARTHROPLASTY Right 10/14/2019   TOTAL KNEE ARTHROPLASTY Left 05/18/2019   Dr. Tacey Ruiz ([facility])   TRANSTHORACIC ECHOCARDIOGRAM  04/15/2019   EF 55-60%, nl wall motion.    Outpatient Medications Prior to Visit  Medication Sig Dispense Refill   atorvastatin (LIPITOR) 80 MG tablet Take 1 tablet (80 mg total) by mouth daily. 90 tablet 3   metroNIDAZOLE (METROCREAM) 0.75 % cream Apply 1 application topically 2 (two) times daily.     No facility-administered medications prior to visit.    No Known Allergies  ROS As per HPI  PE: Vitals with BMI 07/24/2021 01/04/2021 06/08/2020  Height _0  _1  _2   Weight 256 lbs 13 oz 266 lbs 10 oz 274 lbs 13 oz  BMI 35.83 00.3 70.48  Systolic 889 169 450  Diastolic 74 79 70  Pulse 73 71 68   Physical Exam  Gen: Alert, well appearing.  Patient is oriented to person, place, time, and situation. AFFECT: pleasant, lucid thought and speech. ABD: soft, NT/ND.  1cm umbilical hernia present, easily reducible.  Mildly uncomfortable to pressure but not tender.   LABS:  Last CBC Lab Results  Component Value Date   WBC 5.2 01/11/2021   HGB 14.7 01/11/2021   HCT 42.8 01/11/2021   MCV 87.2 01/11/2021   MCH 29.7 10/14/2017   RDW 13.9 01/11/2021   PLT 247.0 38/88/2800   Last metabolic panel Lab Results  Component Value Date   GLUCOSE 100 (H) 01/11/2021   NA 140 01/11/2021   K 4.5 01/11/2021   CL 102 01/11/2021   CO2 25 01/11/2021   BUN 18 01/11/2021   CREATININE 1.06 01/11/2021   GFRNONAA 72 04/15/2019   CALCIUM 9.6 01/11/2021   PROT 7.0 01/11/2021   ALBUMIN 4.4 01/11/2021   BILITOT 0.8 01/11/2021   ALKPHOS 57 01/11/2021   AST 26 04/19/2021   ALT 33 04/19/2021   Lab Results  Component Value Date   CHOL 139 04/19/2021   HDL 51.80 04/19/2021   LDLCALC 63 04/19/2021   LDLDIRECT 181.0 01/11/2021   TRIG 119.0 04/19/2021    CHOLHDL 3 04/19/2021   Last hemoglobin A1c No results found for: HGBA1C Last thyroid functions Lab Results  Component Value Date   TSH 2.47 01/11/2021   Lab Results  Component Value Date   TESTOSTERONE 303.09 01/11/2021   IMPRESSION AND PLAN:  #1.  Major depressive disorder, recurrent, mild to moderate. He is very worried about an 8 antidepressant potentially causing weight gain.  He was on sertraline in the past and this did help his depression and he got off it within about a year. He wishes to pursue a trial of Wellbutrin XL 150/day.  He is aware that this has the potential to make anxiety  worse.  2. Obesity, BMI 35.  He is interested in medication trial as adjunct to his diet and exercise. Decided to start Ozempic 0.5 mg subcu weekly.  Checking A1c and c-Met today.  #3.  History of low testosterone.  No repeat lab testing today but we will approach this again in the future if libido and erectile dysfunction do not significantly improve with improvement in his depression.  #4 umbilical hernia, small, reducible.  Reassured patient. Signs/symptoms to call or return for were reviewed and pt expressed understanding.  An After Visit Summary was printed and given to the patient.  FOLLOW UP: Return in about 4 weeks (around 08/21/2021) for f/u mood/wt.  Signed:  Crissie Sickles, MD           07/24/2021

## 2021-07-24 NOTE — Telephone Encounter (Signed)
I am confused--the patient said that insurance coverage was not an issue and to prescribe whatever I felt was the right thing (???). Please verify this with him, because if insurance coverage IS an issue then I cannot just guess what his plan covers.  He will need to call them and ask if any of the following are covered: mounjaro or saxenda.

## 2021-07-24 NOTE — Telephone Encounter (Signed)
Please review and advise medication is not covered.

## 2021-07-25 ENCOUNTER — Encounter: Payer: Self-pay | Admitting: Family Medicine

## 2021-07-25 LAB — COMPREHENSIVE METABOLIC PANEL
ALT: 27 U/L (ref 0–53)
AST: 22 U/L (ref 0–37)
Albumin: 4.4 g/dL (ref 3.5–5.2)
Alkaline Phosphatase: 62 U/L (ref 39–117)
BUN: 19 mg/dL (ref 6–23)
CO2: 32 mEq/L (ref 19–32)
Calcium: 9.6 mg/dL (ref 8.4–10.5)
Chloride: 100 mEq/L (ref 96–112)
Creatinine, Ser: 1 mg/dL (ref 0.40–1.50)
GFR: 83.2 mL/min (ref >60.00)
Glucose, Bld: 97 mg/dL (ref 70–99)
Potassium: 4 mEq/L (ref 3.5–5.1)
Sodium: 140 mEq/L (ref 135–145)
Total Bilirubin: 0.8 mg/dL (ref 0.2–1.2)
Total Protein: 7.2 g/dL (ref 6.0–8.3)

## 2021-07-25 LAB — HEMOGLOBIN A1C: Hgb A1c MFr Bld: 6 % (ref 4.6–6.5)

## 2021-07-25 NOTE — Telephone Encounter (Signed)
LM for pt regarding med

## 2021-07-26 NOTE — Telephone Encounter (Signed)
Pt advised of alternative medications to contact insurance about. Confirmed [facility] did not have Ozempic in stock and would need PA with pt's insurance, after insurance without PA medication is $1052.

## 2021-07-26 NOTE — Telephone Encounter (Signed)
Spoke with Joshua Mcconnell to complete PA, Mounjaro 2.5MG /0.33mL with sig directions inject 2.5mg  into the skin once a week. Turn around time is 5 days for pharmacist review. Pt informed. PA case # [id number].

## 2021-07-26 NOTE — Telephone Encounter (Signed)
[name] is another one he could ask his insurer about

## 2021-08-01 NOTE — Telephone Encounter (Signed)
PA denied as of 12/16 for [facility].   Please review and advise

## 2021-08-01 NOTE — Telephone Encounter (Signed)
Spoke with pt and informed PA denied. He would like Mounjaro rx sent to the pharmacy and if medication is too expensive, he will not pick it up.  Rx pending

## 2021-08-01 NOTE — Telephone Encounter (Signed)
I'm confused! First I rx'd ozempic and it was not covered so then I rx'd mounjaro b/c that's what insurance said was ok.  Now mounjaro was denied and i'm being asked to rx ozempic again???? Pls clear this up for me. Thx.

## 2021-08-15 ENCOUNTER — Other Ambulatory Visit: Payer: Self-pay | Admitting: Family Medicine

## 2021-08-30 ENCOUNTER — Encounter: Payer: Self-pay | Admitting: Family Medicine

## 2021-09-04 ENCOUNTER — Ambulatory Visit: Payer: 59 | Admitting: Family Medicine

## 2021-09-04 ENCOUNTER — Other Ambulatory Visit: Payer: Self-pay

## 2021-09-04 ENCOUNTER — Encounter: Payer: Self-pay | Admitting: Family Medicine

## 2021-09-04 VITALS — BP 137/83 | HR 78 | Temp 97.9°F | Ht 71.0 in | Wt 244.0 lb

## 2021-09-04 DIAGNOSIS — F331 Major depressive disorder, recurrent, moderate: Secondary | ICD-10-CM

## 2021-09-04 MED ORDER — BUPROPION HCL ER (XL) 300 MG PO TB24: 300.0000 mg | ORAL_TABLET | Freq: Every day | ORAL | 0 refills | Status: DC

## 2021-09-04 NOTE — Progress Notes (Signed)
OFFICE VISIT  09/04/2021  CC:  Chief Complaint  Patient presents with   Follow-up    Depression, would like to discuss increase in Wellbutrin   HPI:    Patient is a 61 y.o. male who presents for 5 wk f/u depression and obesity/wt mgmt. A/P as of last visit: "#1.  Major depressive disorder, recurrent, mild to moderate. He is very worried about an antidepressant potentially causing weight gain.  He was on sertraline in the past and this did help his depression and he got off it within about a year. He wishes to pursue a trial of Wellbutrin XL 150/day.  He is aware that this has the potential to make anxiety worse.  2. Obesity, BMI 35.  He is interested in medication trial as adjunct to his diet and exercise. Decided to start Ozempic 0.5 mg subcu weekly.  Checking A1c and c-Met today.   #3.  History of low testosterone.  No repeat lab testing today but we will approach this again in the future if libido and erectile dysfunction do not significantly improve with improvement in his depression.   #4 umbilical hernia, small, reducible.  Reassured patient. Signs/symptoms to call or return for were reviewed and pt expressed understanding."  INTERIM HX: Due to insurance issues we ended up trying rx'ing mounjaro instead of ozempic but insurance would not cover this either and it was going to be $1000 out-of-pocket. He chose not to get this.  He has been working hard on losing weight and has lost 12 pounds in the last 5 weeks.  Unfortunately his depression is still pretty bad but he does say he got some improvement on the 150 Wellbutrin dosing.  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  Therefore Joshua Mcconnell has been doing the extra work himself and this has been feeling a bit more hopeless.  Ongoing struggles with his marriage, says currently separated and may be getting a divorce--wife in [place] currently.  He feels lonely, discouraged, displays anhedonia, feels like  crying all the time. He does attend counseling/therapy weekly and feels like this is beneficial. No SI or HI.  No hallucinations or delusions.   Past Medical History:  Diagnosis Date   Anxiety and depression 2019   Improved with low dose sertraline and clonaz   Atypical chest pain 01/2019   ordered stress test but denied by insurer   Chronic fatigue    COVID-19 virus infection 10/2019   DDD (degenerative disc disease), lumbar    L4-5 per pt (did PT at [facility])   Elevated PSA 12/2019   Recommended urology referral 12/12/19   Family history of colon cancer    Sister at age 58   Hx of adenomatous polyp of colon 2012/2015/2020   2012: tubular adenoma.  2015 Benign polyp--09/2013 ([facility]). 01/2019 adenomatous polyp (Dr. Loletha Carrow, [facility]) recall 5 yrs.   Hyperlipidemia    Great response to statin.   Hypogonadism male    Obesity, Class II, BMI 35-39.9    Prediabetes 07/2021   07/2021 a1c 6%   Right ankle pain 03/2017   x-ray c/w soft tissue swelling anteriorly--? tendonitis.  Some osteoarthritis changes as well.-->Total ankle arthroplasty 10/2019.    Past Surgical History:  Procedure Laterality Date   COLONOSCOPY     COLONOSCOPY W/ POLYPECTOMY  08/2010; 09/2013; 02/04/19   polypectomy (adenomatous) 01/2019->Recall 5 yrs   CPAP  12/2020   CT CORONARY CA SCORING  2020   Score  68.4,nonobst cad, echo 04/2019 normal.   POLYPECTOMY     TOTAL ANKLE ARTHROPLASTY Right 10/14/2019   TOTAL KNEE ARTHROPLASTY Left 05/18/2019   Dr. Tacey Ruiz ([facility])   TRANSTHORACIC ECHOCARDIOGRAM  04/15/2019   EF 55-60%, nl wall motion.    Outpatient Medications Prior to Visit  Medication Sig Dispense Refill   atorvastatin (LIPITOR) 80 MG tablet Take 1 tablet (80 mg total) by mouth daily. 90 tablet 3   buPROPion (WELLBUTRIN XL) 150 MG 24 hr tablet Take 1 tablet (150 mg total) by mouth daily. 30 tablet 1   metroNIDAZOLE (METROCREAM) 0.75 % cream Apply 1 application topically 2 (two) times daily.      tirzepatide [facility]) 2.5 MG/0.5ML Pen Inject 2.5 mg into the skin once a week. (Patient not taking: Reported on 09/04/2021) 2 mL 0   No facility-administered medications prior to visit.    No Known Allergies  ROS As per HPI  PE: Vitals with BMI 09/04/2021 07/24/2021 01/04/2021  Height $Remov'5\' 11"'MAzKEy$  $RemoveB'5\' 11"'GuSwcqDz$  $RemoveBef'5\' 11"'RYLFkzXwfa$   Weight 244 lbs 256 lbs 13 oz 266 lbs 10 oz  BMI 34.05 98.26 41.5  Systolic 830 940 768  Diastolic 83 74 79  Pulse 78 73 71   Physical Exam  Gen: Alert, well appearing.  Patient is oriented to person, place, time, and situation. AFFECT: Somewhat flat.  Thought and speech are lucid.  LABS:  Last CBC Lab Results  Component Value Date   WBC 5.2 01/11/2021   HGB 14.7 01/11/2021   HCT 42.8 01/11/2021   MCV 87.2 01/11/2021   MCH 29.7 10/14/2017   RDW 13.9 01/11/2021   PLT 247.0 08/81/1031   Last metabolic panel Lab Results  Component Value Date   GLUCOSE 97 07/24/2021   NA 140 07/24/2021   K 4.0 07/24/2021   CL 100 07/24/2021   CO2 32 07/24/2021   BUN 19 07/24/2021   CREATININE 1.00 07/24/2021   GFRNONAA 72 04/15/2019   CALCIUM 9.6 07/24/2021   PROT 7.2 07/24/2021   ALBUMIN 4.4 07/24/2021   BILITOT 0.8 07/24/2021   ALKPHOS 62 07/24/2021   AST 22 07/24/2021   ALT 27 07/24/2021   Last lipids Lab Results  Component Value Date   CHOL 139 04/19/2021   HDL 51.80 04/19/2021   LDLCALC 63 04/19/2021   LDLDIRECT 181.0 01/11/2021   TRIG 119.0 04/19/2021   CHOLHDL 3 04/19/2021   Last hemoglobin A1c Lab Results  Component Value Date   HGBA1C 6.0 07/24/2021   Last thyroid functions Lab Results  Component Value Date   TSH 2.47 01/11/2021   Lab Results  Component Value Date   TESTOSTERONE 303.09 01/11/2021   IMPRESSION AND PLAN:  Major depressive disorder, recurrent. Increase Wellbutrin XL to 300 mg/day.  Continue weekly therapy. Encouraged him today.  Hopefully things will start to look up regarding his work/company situation and his  marriage.  Obesity: Insurance issues prevent use of medications to assist with weight loss. Fortunately he is doing well with diet and exercise and weight has dropped 12 pounds over the last 5 weeks.  An After Visit Summary was printed and given to the patient.  FOLLOW UP: No follow-ups on file.  Signed:  Crissie Sickles, MD           09/04/2021

## 2021-09-04 NOTE — Addendum Note (Signed)
Addended by: Octaviano Glow on: 09/04/2021 04:37 PM   Modules accepted: Orders

## 2021-09-18 ENCOUNTER — Other Ambulatory Visit: Payer: Self-pay | Admitting: Family Medicine

## 2021-09-26 ENCOUNTER — Other Ambulatory Visit: Payer: Self-pay | Admitting: Family Medicine

## 2021-09-30 ENCOUNTER — Other Ambulatory Visit: Payer: Self-pay | Admitting: Family Medicine

## 2021-10-25 ENCOUNTER — Other Ambulatory Visit: Payer: Self-pay | Admitting: Family Medicine

## 2021-10-28 ENCOUNTER — Encounter: Payer: Self-pay | Admitting: Family Medicine

## 2021-10-28 ENCOUNTER — Other Ambulatory Visit: Payer: Self-pay

## 2021-10-28 ENCOUNTER — Telehealth: Payer: 59 | Admitting: Family Medicine

## 2021-10-28 DIAGNOSIS — F3342 Major depressive disorder, recurrent, in full remission: Secondary | ICD-10-CM

## 2021-10-28 MED ORDER — BUPROPION HCL ER (XL) 300 MG PO TB24: 300.0000 mg | ORAL_TABLET | Freq: Every day | ORAL | 1 refills | Status: DC

## 2021-10-28 NOTE — Progress Notes (Signed)
Virtual Visit via Video Note ? ?I connected with pt on 10/28/21 at  3:40 PM EDT by a video enabled telemedicine application and verified that I am speaking with the correct person using two identifiers. ? Location patient: [place] ?Location provider:work or home office ?Persons participating in the virtual visit: patient, provider ? ?I discussed the limitations and requested verbal permission for telemedicine visit. The patient expressed understanding and agreed to proceed. ? ?CC ?61 year old male being seen today for follow-up depression. ?I last saw him 09/04/21. ?A/P as of that visit: ?"Major depressive disorder, recurrent. ?Increase Wellbutrin XL to 300 mg/day.  Continue weekly therapy. ?Encouraged him today.  Hopefully things will start to look up regarding his work/company situation and his marriage." ? ?INTERIM HX: ?Joshua Mcconnell is doing significantly better. ?No more big swings in mood like he was having before. ?Says all the situations in his life are about the same but he does feels like he pushes through and processes things better. ?He is happy to say he has purposefully lost about 35 pounds over the last 4 months. ?He started getting serious about it when his A1c was 6.0 in December last year. ? ?Immunization History  ?Administered Date(s) Administered  ? Influenza, Quadrivalent, Recombinant, Inj, Pf 04/28/2020  ? Influenza,inj,Quad PF,6+ Mos 06/17/2016, 05/28/2018, 04/18/2019  ? Influenza-Unspecified 07/23/2021  ? PFIZER(Purple Top)SARS-COV-2 Vaccination 10/25/2019, 11/09/2019  ? Tdap 03/11/2017  ? Zoster Recombinat (Shingrix) 05/28/2018, 01/28/2019  ? ? ? ?ROS: See pertinent positives and negatives per HPI. ? ?Past Medical History:  ?Diagnosis Date  ? Anxiety and depression 2019  ? Improved with low dose sertraline and clonaz  ? Atypical chest pain 01/2019  ? ordered stress test but denied by insurer  ? Chronic fatigue   ? COVID-19 virus infection 10/2019  ? DDD (degenerative disc disease), lumbar   ? L4-5 per pt  (did PT at [facility])  ? Elevated PSA 12/2019  ? Recommended urology referral 12/12/19  ? Family history of colon cancer   ? Sister at age 56  ? Hx of adenomatous polyp of colon 2012/2015/2020  ? 2012: tubular adenoma.  2015 Benign polyp--09/2013 ([facility]). 01/2019 adenomatous polyp (Dr. Loletha Carrow, [facility]) recall 5 yrs.  ? Hyperlipidemia   ? Great response to statin.  ? Hypogonadism male   ? Obesity, Class II, BMI 35-39.9   ? Prediabetes 07/2021  ? 07/2021 a1c 6%  ? Right ankle pain 03/2017  ? x-ray c/w soft tissue swelling anteriorly--? tendonitis.  Some osteoarthritis changes as well.-->Total ankle arthroplasty 10/2019.  ? ? ?Past Surgical History:  ?Procedure Laterality Date  ? COLONOSCOPY    ? COLONOSCOPY W/ POLYPECTOMY  08/2010; 09/2013; 02/04/19  ? polypectomy (adenomatous) 01/2019->Recall 5 yrs  ? CPAP  12/2020  ? CT CORONARY CA SCORING  2020  ? Score 68.4,nonobst cad, echo 04/2019 normal.  ? POLYPECTOMY    ? TOTAL ANKLE ARTHROPLASTY Right 10/14/2019  ? TOTAL KNEE ARTHROPLASTY Left 05/18/2019  ? Dr. Tacey Ruiz [facility])  ? TRANSTHORACIC ECHOCARDIOGRAM  04/15/2019  ? EF 55-60%, nl wall motion.  ? ? ? ?Current Outpatient Medications:  ?  atorvastatin (LIPITOR) 80 MG tablet, Take 1 tablet (80 mg total) by mouth daily., Disp: 90 tablet, Rfl: 3 ?  metroNIDAZOLE (METROCREAM) 0.75 % cream, Apply 1 application topically 2 (two) times daily., Disp: , Rfl:  ?  buPROPion (WELLBUTRIN XL) 300 MG 24 hr tablet, Take 1 tablet (300 mg total) by mouth daily., Disp: 90 tablet, Rfl: 1 ?  tirzepatide [facility])  2.5 MG/0.5ML Pen, Inject 2.5 mg into the skin once a week. (Patient not taking: Reported on 09/04/2021), Disp: 2 mL, Rfl: 0 ? ?EXAM: ? ?VITALS per patient if applicable:  ?Vitals with BMI 09/04/2021 07/24/2021 01/04/2021  ?Height '5\' 11"'$  '5\' 11"'$  '5\' 11"'$   ?Weight 244 lbs 256 lbs 13 oz 266 lbs 10 oz  ?BMI 34.05 35.83 37.2  ?Systolic 735 329 924  ?Diastolic 83 74 79  ?Pulse 78 73 71  ? ? ?GENERAL: alert, oriented, appears well and  in no acute distress ? ?HEENT: atraumatic, conjunttiva clear, no obvious abnormalities on inspection of external nose and ears ? ?NECK: normal movements of the head and neck ? ?LUNGS: on inspection no signs of respiratory distress, breathing rate appears normal, no obvious gross SOB, gasping or wheezing ? ?CV: no obvious cyanosis ? ?MS: moves all visible extremities without noticeable abnormality ? ?PSYCH/NEURO: pleasant and cooperative, no obvious depression or anxiety, speech and thought processing grossly intact ? ?LABS: none today ? ?  Chemistry   ?   ?Component Value Date/Time  ? NA 140 07/24/2021 1441  ? NA 139 05/04/2019 0000  ? K 4.0 07/24/2021 1441  ? CL 100 07/24/2021 1441  ? CO2 32 07/24/2021 1441  ? BUN 19 07/24/2021 1441  ? BUN 15 05/04/2019 0000  ? CREATININE 1.00 07/24/2021 1441  ? CREATININE 1.09 03/11/2017 1327  ? GLU 104 05/04/2019 0000  ?    ?Component Value Date/Time  ? CALCIUM 9.6 07/24/2021 1441  ? ALKPHOS 62 07/24/2021 1441  ? AST 22 07/24/2021 1441  ? ALT 27 07/24/2021 1441  ? BILITOT 0.8 07/24/2021 1441  ?  ? ?Lab Results  ?Component Value Date  ? HGBA1C 6.0 07/24/2021  ? ?ASSESSMENT AND PLAN: ? ?Discussed the following assessment and plan: ? ?Major depressive disorder, recurrent, in remission. ?Continue Wellbutrin XL 300 mg a day.  Refill sent in for 90-day supply. ? ?Obesity: great purposeful weight loss lately.  He feels improved physically and mentally. ?At 39-monthfollow-up for his physical we will do some more labs. ?  ?I discussed the assessment and treatment plan with the patient. The patient was provided an opportunity to ask questions and all were answered. The patient agreed with the plan and demonstrated an understanding of the instructions. ?  ?F/u: 3 mo ? ?Signed:  PCrissie Sickles MD           10/28/2021 ? ?

## 2022-01-18 ENCOUNTER — Emergency Department (HOSPITAL_COMMUNITY): Payer: 59

## 2022-01-18 ENCOUNTER — Other Ambulatory Visit: Payer: Self-pay

## 2022-01-18 ENCOUNTER — Emergency Department (HOSPITAL_COMMUNITY)
Admission: EM | Admit: 2022-01-18 | Discharge: 2022-01-18 | Disposition: A | Discharge status: Home / Self Care | Payer: 59 | Attending: Emergency Medicine | Admitting: Emergency Medicine

## 2022-01-18 DIAGNOSIS — D72829 Elevated white blood cell count, unspecified: Secondary | ICD-10-CM | POA: Diagnosis not present

## 2022-01-18 DIAGNOSIS — T620X1A Toxic effect of ingested mushrooms, accidental (unintentional), initial encounter: Secondary | ICD-10-CM | POA: Insufficient documentation

## 2022-01-18 DIAGNOSIS — F419 Anxiety disorder, unspecified: Secondary | ICD-10-CM | POA: Diagnosis not present

## 2022-01-18 LAB — COMPREHENSIVE METABOLIC PANEL
ALT: 28 U/L (ref 0–44)
AST: 34 U/L (ref 15–41)
Albumin: 4 g/dL (ref 3.5–5.0)
Alkaline Phosphatase: 63 U/L (ref 38–126)
Anion gap: 10 (ref 5–15)
BUN: 16 mg/dL (ref 6–20)
CO2: 23 mmol/L (ref 22–32)
Calcium: 8.8 mg/dL — ABNORMAL LOW (ref 8.9–10.3)
Chloride: 102 mmol/L (ref 98–111)
Creatinine, Ser: 1.18 mg/dL (ref 0.61–1.24)
GFR, Estimated: >60 mL/min (ref >60)
Glucose, Bld: 112 mg/dL — ABNORMAL HIGH (ref 70–99)
Potassium: 3.7 mmol/L (ref 3.5–5.1)
Sodium: 135 mmol/L (ref 135–145)
Total Bilirubin: 1.3 mg/dL — ABNORMAL HIGH (ref 0.3–1.2)
Total Protein: 6.7 g/dL (ref 6.5–8.1)

## 2022-01-18 LAB — CBC WITH DIFFERENTIAL/PLATELET
Abs Immature Granulocytes: 0.04 10*3/uL (ref 0.00–0.07)
Basophils Absolute: 0.1 10*3/uL (ref 0.0–0.1)
Basophils Relative: 1 %
Eosinophils Absolute: 0 10*3/uL (ref 0.0–0.5)
Eosinophils Relative: 0 %
HCT: 42.5 % (ref 39.0–52.0)
Hemoglobin: 14.6 g/dL (ref 13.0–17.0)
Immature Granulocytes: 0 %
Lymphocytes Relative: 14 %
Lymphs Abs: 1.7 10*3/uL (ref 0.7–4.0)
MCH: 29.7 pg (ref 26.0–34.0)
MCHC: 34.4 g/dL (ref 30.0–36.0)
MCV: 86.4 fL (ref 80.0–100.0)
Monocytes Absolute: 0.8 10*3/uL (ref 0.1–1.0)
Monocytes Relative: 7 %
Neutro Abs: 9.6 10*3/uL — ABNORMAL HIGH (ref 1.7–7.7)
Neutrophils Relative %: 78 %
Platelets: 247 10*3/uL (ref 150–400)
RBC: 4.92 MIL/uL (ref 4.22–5.81)
RDW: 12.9 % (ref 11.5–15.5)
WBC: 12.3 10*3/uL — ABNORMAL HIGH (ref 4.0–10.5)
nRBC: 0 % (ref 0.0–0.2)

## 2022-01-18 LAB — TROPONIN I (HIGH SENSITIVITY): Troponin I (High Sensitivity): 13 ng/L (ref <18)

## 2022-01-18 MED ORDER — LORAZEPAM 2 MG/ML IJ SOLN
0.5000 mg | Freq: Once | INTRAMUSCULAR | Status: AC
  Administered 2022-01-18: 0.5 mg via INTRAVENOUS
  Filled 2022-01-18: qty 1

## 2022-01-18 MED ORDER — LORAZEPAM 2 MG/ML IJ SOLN
1.0000 mg | Freq: Once | INTRAMUSCULAR | Status: AC
  Administered 2022-01-18: 1 mg via INTRAVENOUS
  Filled 2022-01-18: qty 1

## 2022-01-18 NOTE — ED Provider Notes (Signed)
[facility] EMERGENCY DEPARTMENT Provider Note   CSN: [id number] Arrival date & time: 01/18/22  2030     History  Chief Complaint  Patient presents with   Ingestion    Joshua Mcconnell is a 61 y.o. male.  Patient is a 61 year old male with a history of chronic fatigue, depression, hyperlipidemia, prediabetes and atypical chest pain who is presenting today with EMS after taking a psychedelic mushroom approximately 1 hour ago and now having symptoms of shortness of breath, chest pressure and pain, elevated blood pressure.  Patient reports that he took the mushroom hoping to have a different perspective and experience but he has been feeling terrible ever since he took it.  He is anxious and having the above symptoms.  He feels like he is hearing things and does not feel himself.  He reports since being here he feels like the chest pressure is better but he still feeling short of breath which is making him feel very anxious.  His wife reports he recently underwent cardiac evaluation with everything being normal.  He has never used these mushrooms before.  They got them from a friend who got them from [place].  He is not sure what kind of mushrooms they are.  He did receive Zofran by EMS prior to arrival.  Patient's wife also took these and reports she is having similar symptoms are just not as severe.  The history is provided by the patient and the spouse.  Ingestion       Home Medications Prior to Admission medications   Medication Sig Start Date End Date Taking? Authorizing Provider  atorvastatin (LIPITOR) 80 MG tablet Take 1 tablet (80 mg total) by mouth daily. 04/22/21   McGowen, Adrian Blackwater, MD  buPROPion (WELLBUTRIN XL) 300 MG 24 hr tablet Take 1 tablet (300 mg total) by mouth daily. 10/28/21   McGowen, Adrian Blackwater, MD  metroNIDAZOLE (METROCREAM) 0.75 % cream Apply 1 application topically 2 (two) times daily. 04/10/21   [provider]  tirzepatide Darcel Bayley) 2.5  MG/0.5ML Pen Inject 2.5 mg into the skin once a week. Patient not taking: Reported on 09/04/2021 08/01/21   Tammi Sou, MD      Allergies    Patient has no known allergies.    Review of Systems   Review of Systems  Physical Exam Updated Vital Signs BP (!) 166/93   Pulse 74   Temp 98.8 F (37.1 C) (Oral)   Resp 18   Ht '5\' 11"'$  (1.803 m)   Wt 110 kg   SpO2 97%   BMI 33.82 kg/m  Physical Exam Vitals and nursing note reviewed.  Constitutional:      General: He is not in acute distress.    Appearance: He is well-developed.     Comments: Keeps his eyes closed but is able to answer questions appropriately  HENT:     Head: Normocephalic and atraumatic.  Eyes:     Conjunctiva/sclera: Conjunctivae normal.     Pupils: Pupils are equal, round, and reactive to light.  Cardiovascular:     Rate and Rhythm: Regular rhythm. Tachycardia present.     Heart sounds: No murmur heard. Pulmonary:     Effort: Pulmonary effort is normal. No respiratory distress.     Breath sounds: Normal breath sounds. No wheezing or rales.     Comments: Tachypneic Abdominal:     General: There is no distension.     Palpations: Abdomen is soft.     Tenderness:  There is no abdominal tenderness. There is no guarding or rebound.  Musculoskeletal:        General: No tenderness. Normal range of motion.     Cervical back: Normal range of motion and neck supple.  Skin:    General: Skin is warm and dry.     Findings: No erythema or rash.  Neurological:     Mental Status: He is oriented to person, place, and time.  Psychiatric:     Comments: Seems anxious.  Not responding to internal stimuli     ED Results / Procedures / Treatments   Labs (all labs ordered are listed, but only abnormal results are displayed) Labs Reviewed  CBC WITH DIFFERENTIAL/PLATELET - Abnormal; Notable for the following components:      Result Value   WBC 12.3 (*)    Neutro Abs 9.6 (*)    All other components within normal  limits  COMPREHENSIVE METABOLIC PANEL - Abnormal; Notable for the following components:   Glucose, Bld 112 (*)    Calcium 8.8 (*)    Total Bilirubin 1.3 (*)    All other components within normal limits  TROPONIN I (HIGH SENSITIVITY)  TROPONIN I (HIGH SENSITIVITY)    EKG EKG Interpretation  Date/Time:  Saturday January 18 2022 21:36:41 EDT Ventricular Rate:  80 PR Interval:  138 QRS Duration: 88 QT Interval:  370 QTC Calculation: 426 R Axis:   76 Text Interpretation: Normal sinus rhythm Normal ECG No previous ECGs available Confirmed by Blanchie Dessert [phone]) on 01/18/2022 9:50:06 PM  Radiology DG Chest 2 View  Result Date: 01/18/2022 CLINICAL DATA:  Dyspnea EXAM: CHEST - 2 VIEW COMPARISON:  None Available. FINDINGS: The heart size and mediastinal contours are within normal limits. Both lungs are clear. The visualized skeletal structures are unremarkable. IMPRESSION: No active cardiopulmonary disease. Electronically Signed   By: Fidela Salisbury M.D.   On: 01/18/2022 21:46    Procedures Procedures    Medications Ordered in ED Medications  LORazepam (ATIVAN) injection 1 mg (1 mg Intravenous Given 01/18/22 2146)  LORazepam (ATIVAN) injection 0.5 mg (0.5 mg Intravenous Given 01/18/22 2239)    ED Course/ Medical Decision Making/ A&P                           Medical Decision Making Amount and/or Complexity of Data Reviewed Independent Historian: spouse and EMS Labs: ordered. Decision-making details documented in ED Course. Radiology: ordered and independent interpretation performed. Decision-making details documented in ED Course. ECG/medicine tests: ordered and independent interpretation performed. Decision-making details documented in ED Course.  Risk Prescription drug management.   Patient presenting today with symptoms of chest pain, shortness of breath and anxiety taking a psychedelic mushroom.  They were given to them by a friend and came from [place].  He is still  currently tachypneic but breath sounds are clear.  He reports the chest pain is improving but is noted to be hypertensive here.  He denies any alcohol or other drug use today.  He has no prior history of elevated blood pressure.  Oxygen saturation is 98% on room air.  Given patient's symptoms and side effects we will give a dose of Ativan and continue to monitor closely.  On the monitor patient does not display any evidence of dysrhythmia.  EKG is pending.  10:59 PM I independently interpreted patient's labs and EKG.  EKG is within normal limits, labs with minimal leukocytosis of 12, normal CMP and negative troponin.  I have independently visualized and interpreted pt's images today.  Chest x-ray within normal limits.  On reevaluation after 1 mg of Ativan patient is starting to feel better and blood pressure is starting to come down.  He reports he still feeling a bit anxious and was given another 0.5 mg of Ativan.  Suspect patient will be able to go home with family once anxiety is controlled.        Final Clinical Impression(s) / ED Diagnoses Final diagnoses:  Toxic effect of ingested mushroom, unintentional, initial encounter    Rx / DC Orders ED Discharge Orders     None         Blanchie Dessert, MD 01/18/22 2259

## 2022-01-18 NOTE — Discharge Instructions (Addendum)
It will take 6-12 hours for the effects to wear off.  Get some rest.

## 2022-01-18 NOTE — ED Triage Notes (Signed)
Pt BIB EMS from home. Pt reports taking mushrooms around 1hr PTA and has since felt nauseated, dizzy, disoriented, and felt dry mouth. Pt initial BP was 250/116 with EMS - last BP 119/110 - no reported hx of HTN. EMS gave zofran en route. Pt is able to answer all questions, somewhat slow to respond.  Pt reports he feels better and can go home on arrival.  [facility]

## 2022-01-18 NOTE — ED Provider Notes (Signed)
  Provider Note MRN:  [id number]  Arrival date & time: 01/18/22    ED Course and Medical Decision Making  Assumed care from Dr. Maryan Rued at shift change.  Most room ingestion with some anxiety, doing better now, blood pressure improving, work-up reassuring with negative troponin, appropriate for discharge.  Procedures  Final Clinical Impressions(s) / ED Diagnoses     ICD-10-CM   1. Toxic effect of ingested mushroom, unintentional, initial encounter  T62.0X1A       ED Discharge Orders     None         Discharge Instructions      It will take 6-12 hours for the effects to wear off.  Get some rest.    Barth Kirks. Sedonia Small, [place] [email or web address]    Maudie Flakes, MD 01/18/22 [phone]

## 2022-01-19 ENCOUNTER — Other Ambulatory Visit: Payer: Self-pay | Admitting: Family Medicine

## 2022-01-21 ENCOUNTER — Encounter: Payer: Self-pay | Admitting: Family Medicine

## 2022-01-21 ENCOUNTER — Telehealth (INDEPENDENT_AMBULATORY_CARE_PROVIDER_SITE_OTHER): Payer: 59 | Admitting: Family Medicine

## 2022-01-21 DIAGNOSIS — F3341 Major depressive disorder, recurrent, in partial remission: Secondary | ICD-10-CM | POA: Diagnosis not present

## 2022-01-21 DIAGNOSIS — R7303 Prediabetes: Secondary | ICD-10-CM

## 2022-01-21 DIAGNOSIS — E7800 Pure hypercholesterolemia, unspecified: Secondary | ICD-10-CM

## 2022-01-21 MED ORDER — ATORVASTATIN CALCIUM 80 MG PO TABS: 80.0000 mg | ORAL_TABLET | Freq: Every day | ORAL | 3 refills | Status: DC

## 2022-01-21 MED ORDER — BUPROPION HCL ER (XL) 300 MG PO TB24: 300.0000 mg | ORAL_TABLET | Freq: Every day | ORAL | 1 refills | Status: DC

## 2022-01-21 NOTE — Progress Notes (Signed)
Virtual Visit via Video Note  I connected with Joshua Mcconnell on 01/21/22 at  8:40 AM EDT by a video enabled telemedicine application and verified that I am speaking with the correct person using two identifiers.  Location patient: [place] Location provider:work or home office Persons participating in the virtual visit: patient, provider  I discussed the limitations and requested verbal permission for telemedicine visit. The patient expressed understanding and agreed to proceed.  61 y/o male being seen today for 3 mo f/u recurrent MDD, prediabetes, HLD. A/P as of last visit: "1 Major depressive disorder, recurrent, in remission. Continue Wellbutrin XL 300 mg a day.  Refill sent in for 90-day supply.   2 Obesity: great purposeful weight loss lately.  He feels improved physically and mentally. At 19-month follow-up for his physical we will do some more labs."  INTERIM HX: Joshua Mcconnell tells me he has been struggling some lately, particularly with regard to fluctuations in mood and difficulties in his relationship with his wife.  He is anxious about the upcoming birth of his first grandchild. He felt like he be in a better place by now and is a bit frustrated by this, but has some optimism about going forward the next few months.  Wants to remain on his current medications.  3 days ago went to the ED for marked anxiety after taking some hallucinatory mushrooms.  This is the first time he had done this and realizes it was a big mistake. CBC and c-Met normal, EKG normal.  He was given some Ativan and eventually his severe anxiety subsided.  ROS: See pertinent positives and negatives per HPI.  Past Medical History:  Diagnosis Date   Anxiety and depression 2019   Improved with low dose sertraline and clonaz   Atypical chest pain 01/2019   ordered stress test but denied by insurer   Chronic fatigue    COVID-19 virus infection 10/2019   DDD (degenerative disc disease), lumbar    L4-5 per pt (did PT at [place] [facility])    Elevated PSA 12/2019   Recommended urology referral 12/12/19   Family history of colon cancer    Sister at age 31   Hx of adenomatous polyp of colon 2012/2015/2020   2012: tubular adenoma.  2015 Benign polyp--09/2013 ([facility]). 01/2019 adenomatous polyp (Dr. Loletha Carrow, [facility]) recall 5 yrs.   Hyperlipidemia    Great response to statin.   Hypogonadism male    Obesity, Class II, BMI 35-39.9    Prediabetes 07/2021   07/2021 a1c 6%   Right ankle pain 03/2017   x-ray c/w soft tissue swelling anteriorly--? tendonitis.  Some osteoarthritis changes as well.-->Total ankle arthroplasty 10/2019.    Past Surgical History:  Procedure Laterality Date   COLONOSCOPY     COLONOSCOPY W/ POLYPECTOMY  08/2010; 09/2013; 02/04/19   polypectomy (adenomatous) 01/2019->Recall 5 yrs   CPAP  12/2020   CT CORONARY CA SCORING  2020   Score 68.4,nonobst cad, echo 04/2019 normal.   POLYPECTOMY     TOTAL ANKLE ARTHROPLASTY Right 10/14/2019   TOTAL KNEE ARTHROPLASTY Left 05/18/2019   Dr. Tacey Ruiz ([facility])   TRANSTHORACIC ECHOCARDIOGRAM  04/15/2019   EF 55-60%, nl wall motion.     Current Outpatient Medications:    metroNIDAZOLE (METROCREAM) 0.75 % cream, Apply 1 application topically 2 (two) times daily., Disp: , Rfl:    atorvastatin (LIPITOR) 80 MG tablet, Take 1 tablet (80 mg total) by mouth daily., Disp: 90 tablet, Rfl: 3   buPROPion (WELLBUTRIN XL) 300  MG 24 hr tablet, Take 1 tablet (300 mg total) by mouth daily., Disp: 90 tablet, Rfl: 1  EXAM:  VITALS per patient if applicable:     2/70/3500   10:38 PM 01/18/2022    9:45 PM 01/18/2022    8:43 PM  Vitals with BMI  Height   '5\' 11"'$   Weight   242 lbs 8 oz  BMI   93.81  Systolic 829 937   Diastolic 93 93   Pulse 74      GENERAL: alert, oriented, appears well and in no acute distress  HEENT: atraumatic, conjunttiva clear, no obvious abnormalities on inspection of external nose and ears  NECK: normal movements of the head and  neck  LUNGS: on inspection no signs of respiratory distress, breathing rate appears normal, no obvious gross SOB, gasping or wheezing  CV: no obvious cyanosis  MS: moves all visible extremities without noticeable abnormality  PSYCH/NEURO: pleasant and cooperative, no obvious depression or anxiety, speech and thought processing grossly intact  LABS: none today    Chemistry      Component Value Date/Time   NA 135 01/18/2022 2141   NA 139 05/04/2019 0000   K 3.7 01/18/2022 2141   CL 102 01/18/2022 2141   CO2 23 01/18/2022 2141   BUN 16 01/18/2022 2141   BUN 15 05/04/2019 0000   CREATININE 1.18 01/18/2022 2141   CREATININE 1.09 03/11/2017 1327   GLU 104 05/04/2019 0000      Component Value Date/Time   CALCIUM 8.8 (L) 01/18/2022 2141   ALKPHOS 63 01/18/2022 2141   AST 34 01/18/2022 2141   ALT 28 01/18/2022 2141   BILITOT 1.3 (H) 01/18/2022 2141     Lab Results  Component Value Date   HGBA1C 6.0 07/24/2021   Lab Results  Component Value Date   CHOL 139 04/19/2021   HDL 51.80 04/19/2021   LDLCALC 63 04/19/2021   LDLDIRECT 181.0 01/11/2021   TRIG 119.0 04/19/2021   CHOLHDL 3 04/19/2021   Lab Results  Component Value Date   WBC 12.3 (H) 01/18/2022   HGB 14.6 01/18/2022   HCT 42.5 01/18/2022   MCV 86.4 01/18/2022   PLT 247 01/18/2022   Lab Results  Component Value Date   TSH 2.47 01/11/2021   ASSESSMENT AND PLAN:  Discussed the following assessment and plan:  1) MDD, recurrent, still in partial remission on wellbutrin xl 300 qd. Continue this at least another 3 mo. he would like to get off this medication eventually but understands right now would not be a good time.  #2 hyperlipidemia, doing well on atorvastatin 80 mg a day. He has maintained much improved lifestyle habits and purposeful weight loss. Recheck lipid panel and hepatic panel in 2 to 3 months.  #3 prediabetes: Hemoglobin A1c reached 6.0% in December 2022. He has lost about 20 pounds  purposefully in the last 7 months. Plan repeat A1c and fasting glucose in 2 to 3 months.  I discussed the assessment and treatment plan with the patient. The patient was provided an opportunity to ask questions and all were answered. The patient agreed with the plan and demonstrated an understanding of the instructions.   F/u: 2-3 mo cpe  Signed:  Crissie Sickles, MD           01/21/2022

## 2022-03-21 ENCOUNTER — Encounter: Payer: Self-pay | Admitting: Family Medicine

## 2022-03-21 ENCOUNTER — Ambulatory Visit: Payer: 59 | Admitting: Family Medicine

## 2022-03-21 VITALS — BP 112/75 | HR 76 | Temp 98.2°F | Ht 71.0 in | Wt 238.8 lb

## 2022-03-21 DIAGNOSIS — Z125 Encounter for screening for malignant neoplasm of prostate: Secondary | ICD-10-CM

## 2022-03-21 DIAGNOSIS — E7800 Pure hypercholesterolemia, unspecified: Secondary | ICD-10-CM | POA: Diagnosis not present

## 2022-03-21 DIAGNOSIS — R7303 Prediabetes: Secondary | ICD-10-CM | POA: Diagnosis not present

## 2022-03-21 DIAGNOSIS — Z0000 Encounter for general adult medical examination without abnormal findings: Secondary | ICD-10-CM | POA: Diagnosis not present

## 2022-03-21 DIAGNOSIS — F339 Major depressive disorder, recurrent, unspecified: Secondary | ICD-10-CM

## 2022-03-21 LAB — LIPID PANEL
Cholesterol: 143 mg/dL (ref 0–200)
HDL: 50.5 mg/dL (ref >39.00)
LDL Cholesterol: 72 mg/dL (ref 0–99)
NonHDL: 92.19
Total CHOL/HDL Ratio: 3
Triglycerides: 102 mg/dL (ref 0.0–149.0)
VLDL: 20.4 mg/dL (ref 0.0–40.0)

## 2022-03-21 LAB — COMPREHENSIVE METABOLIC PANEL
ALT: 27 U/L (ref 0–53)
AST: 22 U/L (ref 0–37)
Albumin: 4.5 g/dL (ref 3.5–5.2)
Alkaline Phosphatase: 63 U/L (ref 39–117)
BUN: 17 mg/dL (ref 6–23)
CO2: 31 mEq/L (ref 19–32)
Calcium: 9.7 mg/dL (ref 8.4–10.5)
Chloride: 100 mEq/L (ref 96–112)
Creatinine, Ser: 1.22 mg/dL (ref 0.40–1.50)
GFR: 65.23 mL/min (ref >60.00)
Glucose, Bld: 102 mg/dL — ABNORMAL HIGH (ref 70–99)
Potassium: 4.6 mEq/L (ref 3.5–5.1)
Sodium: 139 mEq/L (ref 135–145)
Total Bilirubin: 1 mg/dL (ref 0.2–1.2)
Total Protein: 7.1 g/dL (ref 6.0–8.3)

## 2022-03-21 LAB — CBC WITH DIFFERENTIAL/PLATELET
Basophils Absolute: 0 10*3/uL (ref 0.0–0.1)
Basophils Relative: 0.6 % (ref 0.0–3.0)
Eosinophils Absolute: 0.1 10*3/uL (ref 0.0–0.7)
Eosinophils Relative: 0.9 % (ref 0.0–5.0)
HCT: 43.6 % (ref 39.0–52.0)
Hemoglobin: 14.8 g/dL (ref 13.0–17.0)
Lymphocytes Relative: 32.9 % (ref 12.0–46.0)
Lymphs Abs: 2.6 10*3/uL (ref 0.7–4.0)
MCHC: 33.9 g/dL (ref 30.0–36.0)
MCV: 88.7 fl (ref 78.0–100.0)
Monocytes Absolute: 0.5 10*3/uL (ref 0.1–1.0)
Monocytes Relative: 6.7 % (ref 3.0–12.0)
Neutro Abs: 4.7 10*3/uL (ref 1.4–7.7)
Neutrophils Relative %: 58.9 % (ref 43.0–77.0)
Platelets: 276 10*3/uL (ref 150.0–400.0)
RBC: 4.91 Mil/uL (ref 4.22–5.81)
RDW: 13.5 % (ref 11.5–15.5)
WBC: 8 10*3/uL (ref 4.0–10.5)

## 2022-03-21 LAB — PSA: PSA: 1.31 ng/mL (ref 0.10–4.00)

## 2022-03-21 LAB — TSH: TSH: 2 u[IU]/mL (ref 0.35–5.50)

## 2022-03-21 LAB — HEMOGLOBIN A1C: Hgb A1c MFr Bld: 5.7 % (ref 4.6–6.5)

## 2022-03-21 NOTE — Progress Notes (Signed)
Office Note 03/21/2022  CC:  Chief Complaint  Patient presents with   Hyperlipidemia    Pt is fasting   Prediabetes   HPI:  Patient is a 61 y.o. male who is here for annual health maintenance exam and follow-up recurrent major depressive disorder, hyperlipidemia, and prediabetes.  He says he is doing well regarding mood and anxiety levels. Consistently takes Wellbutrin XL 300 mg.  He is taking his atorvastatin 80 mg a day. He continues to do well with diet and exercise and gradual purposeful weight loss.  He has had nasal congestion and postnasal drip for the last 7 days or so, ears starting to feel full.  No fever no significant cough, no significant headache or sore throat.  His voice is getting a bit gravelly.   Past Medical History:  Diagnosis Date   Anxiety and depression 2019   Improved with low dose sertraline and clonaz   Atypical chest pain 01/2019   ordered stress test but denied by insurer   Chronic fatigue    COVID-19 virus infection 10/2019   DDD (degenerative disc disease), lumbar    L4-5 per pt (did PT at [facility])   Elevated PSA 12/2019   Recommended urology referral 12/12/19   Family history of colon cancer    Sister at age 89   Hx of adenomatous polyp of colon 2012/2015/2020   2012: tubular adenoma.  2015 Benign polyp--09/2013 ([facility]). 01/2019 adenomatous polyp (Dr. Loletha Carrow, [facility]) recall 5 yrs.   Hyperlipidemia    Great response to statin.   Hypogonadism male    Obesity, Class II, BMI 35-39.9    Prediabetes 07/2021   07/2021 a1c 6%   Right ankle pain 03/2017   x-ray c/w soft tissue swelling anteriorly--? tendonitis.  Some osteoarthritis changes as well.-->Total ankle arthroplasty 10/2019.    Past Surgical History:  Procedure Laterality Date   COLONOSCOPY     COLONOSCOPY W/ POLYPECTOMY  08/2010; 09/2013; 02/04/19   polypectomy (adenomatous) 01/2019->Recall 5 yrs   CPAP  12/2020   CT CORONARY CA SCORING  2020   Score 68.4,nonobst  cad, echo 04/2019 normal.   POLYPECTOMY     TOTAL ANKLE ARTHROPLASTY Right 10/14/2019   TOTAL KNEE ARTHROPLASTY Left 05/18/2019   Dr. Tacey Ruiz ([facility])   TRANSTHORACIC ECHOCARDIOGRAM  04/15/2019   EF 55-60%, nl wall motion.    Family History  Problem Relation Age of Onset   Diabetes Mother    Arthritis Mother    Breast cancer Mother    Heart disease Father    Stroke Father    Hypertension Father    Colon cancer Sister 71   Colon polyps Brother    Arthritis Maternal Grandmother    Esophageal cancer Neg Hx    Rectal cancer Neg Hx    Stomach cancer Neg Hx     Social History   Socioeconomic History   Marital status: Married    Spouse name: Not on file   Number of children: Not on file   Years of education: Not on file   Highest education level: Not on file  Occupational History   Not on file  Tobacco Use   Smoking status: Former    Types: Cigars   Smokeless tobacco: Never   Tobacco comments:    3 cigars per week  Vaping Use   Vaping Use: Never used  Substance and Sexual Activity   Alcohol use: Yes    Alcohol/week: 20.0 standard drinks of alcohol    Types:  10 Cans of beer, 10 Standard drinks or equivalent per week   Drug use: No   Sexual activity: Not on file  Other Topics Concern   Not on file  Social History Narrative   Married, 2 daughters.   Educ: college 4 yrs   Occupation: [employer])   No tobacco.   Alc: 1-2 drinks most days.   Social Determinants of Health   Financial Resource Strain: Not on file  Food Insecurity: Not on file  Transportation Needs: Not on file  Physical Activity: Not on file  Stress: Not on file  Social Connections: Not on file  Intimate Partner Violence: Not on file    Outpatient Medications Prior to Visit  Medication Sig Dispense Refill   atorvastatin (LIPITOR) 80 MG tablet Take 1 tablet (80 mg total) by mouth daily. 90 tablet 3   buPROPion (WELLBUTRIN XL) 300 MG 24 hr tablet Take 1 tablet  (300 mg total) by mouth daily. 90 tablet 1   metroNIDAZOLE (METROCREAM) 0.75 % cream Apply 1 application topically 2 (two) times daily.     No facility-administered medications prior to visit.    No Known Allergies  ROS Review of Systems  Constitutional:  Negative for appetite change, chills, fatigue and fever.  HENT:  Positive for congestion. Negative for dental problem, ear pain (ear fullness) and sore throat.   Eyes:  Negative for discharge, redness and visual disturbance.  Respiratory:  Negative for cough, chest tightness, shortness of breath and wheezing.   Cardiovascular:  Negative for chest pain, palpitations and leg swelling.  Gastrointestinal:  Negative for abdominal pain, blood in stool, diarrhea, nausea and vomiting.  Genitourinary:  Negative for difficulty urinating, dysuria, flank pain, frequency, hematuria and urgency.  Musculoskeletal:  Negative for arthralgias, back pain, joint swelling, myalgias and neck stiffness.  Skin:  Negative for pallor and rash.  Neurological:  Negative for dizziness, speech difficulty, weakness and headaches.  Hematological:  Negative for adenopathy. Does not bruise/bleed easily.  Psychiatric/Behavioral:  Negative for confusion and sleep disturbance. The patient is not nervous/anxious.     PE;    03/21/2022    8:21 AM 01/18/2022   10:38 PM 01/18/2022    9:45 PM  Vitals with BMI  Height '5\' 11"'$     Weight 238 lbs 13 oz    BMI 02.54    Systolic 270 623 762  Diastolic 75 93 93  Pulse 76 74     Gen: Alert, well appearing.  Patient is oriented to person, place, time, and situation. AFFECT: pleasant, lucid thought and speech. ENT: Ears: EACs clear, normal epithelium.  TMs with good light reflex and landmarks bilaterally.  Eyes: no injection, icteris, swelling, or exudate.  EOMI, PERRLA. Nose: no drainage or turbinate edema/swelling.  No injection or focal lesion.  Mouth: lips without lesion/swelling.  Oral mucosa pink and moist.  Dentition  intact and without obvious caries or gingival swelling.  Oropharynx without erythema, exudate, or swelling.  Neck: supple/nontender.  No LAD, mass, or TM.  Carotid pulses 2+ bilaterally, without bruits. CV: RRR, no m/r/g.   LUNGS: CTA bilat, nonlabored resps, good aeration in all lung fields. ABD: soft, NT, ND, BS normal.  No hepatospenomegaly or mass.  No bruits. EXT: no clubbing, cyanosis, or edema.  Musculoskeletal: no joint swelling, erythema, warmth, or tenderness.  ROM of all joints intact. Skin - no sores or suspicious lesions or rashes or color changes  Pertinent labs:  Lab Results  Component Value  Date   TSH 2.47 01/11/2021   Lab Results  Component Value Date   WBC 12.3 (H) 01/18/2022   HGB 14.6 01/18/2022   HCT 42.5 01/18/2022   MCV 86.4 01/18/2022   PLT 247 01/18/2022   Lab Results  Component Value Date   CREATININE 1.18 01/18/2022   BUN 16 01/18/2022   NA 135 01/18/2022   K 3.7 01/18/2022   CL 102 01/18/2022   CO2 23 01/18/2022   Lab Results  Component Value Date   ALT 28 01/18/2022   AST 34 01/18/2022   ALKPHOS 63 01/18/2022   BILITOT 1.3 (H) 01/18/2022   Lab Results  Component Value Date   CHOL 139 04/19/2021   Lab Results  Component Value Date   HDL 51.80 04/19/2021   Lab Results  Component Value Date   LDLCALC 63 04/19/2021   Lab Results  Component Value Date   TRIG 119.0 04/19/2021   Lab Results  Component Value Date   CHOLHDL 3 04/19/2021   Lab Results  Component Value Date   PSA 1.06 01/11/2021   PSA 10.82 (H) 12/09/2019   PSA 0.84 06/01/2018   Lab Results  Component Value Date   HGBA1C 6.0 07/24/2021   Lab Results  Component Value Date   TESTOSTERONE 303.09 01/11/2021   ASSESSMENT AND PLAN:   #1 viral URI. Saline nasal spray recommended   #2 recurrent major depressive disorder, GAD. Doing well long-term on Wellbutrin XL 300 mg/day.    #3 hypercholesterolemia, doing well on atorvastatin 80 mg a day. Lipid panel and  hepatic panel today.    #4 health maintenance exam: Reviewed age and gender appropriate health maintenance issues (prudent diet, regular exercise, health risks of tobacco and excessive alcohol, use of seatbelts, fire alarms in home, use of sunscreen).  Also reviewed age and gender appropriate health screening as well as vaccine recommendations. Vaccines: All up-to-date Labs: Fasting health panel, PSA, hemoglobin A1c. Prostate ca screening: PSA level today. Colon ca screening: recall 2025  An After Visit Summary was printed and given to the patient.  FOLLOW UP:  Return in about 6 months (around 09/21/2022) for routine chronic illness f/u.  Signed:  Crissie Sickles, MD           03/21/2022

## 2022-06-21 ENCOUNTER — Encounter: Payer: Self-pay | Admitting: Family Medicine

## 2022-07-10 ENCOUNTER — Ambulatory Visit: Payer: 59 | Admitting: Family Medicine

## 2022-07-10 ENCOUNTER — Encounter: Payer: Self-pay | Admitting: Family Medicine

## 2022-07-10 VITALS — BP 119/78 | HR 79 | Temp 97.0°F | Wt 247.2 lb

## 2022-07-10 DIAGNOSIS — R6882 Decreased libido: Secondary | ICD-10-CM

## 2022-07-10 DIAGNOSIS — Z23 Encounter for immunization: Secondary | ICD-10-CM

## 2022-07-10 DIAGNOSIS — E291 Testicular hypofunction: Secondary | ICD-10-CM | POA: Diagnosis not present

## 2022-07-10 LAB — TESTOSTERONE: Testosterone: 866.61 ng/dL (ref 300.00–890.00)

## 2022-07-10 MED ORDER — TESTOSTERONE CYPIONATE 200 MG/ML IM SOLN: INTRAMUSCULAR | 0 refills | Status: DC

## 2022-07-10 NOTE — Progress Notes (Signed)
OFFICE VISIT  07/10/2022  CC:  Chief Complaint  Patient presents with   testotersone   Patient is a 61 y.o. male who presents for follow-up of hypogonadism.  HPI: Unfortunately he is getting a divorce. He has very poor sex drive and energy level. He had been on testosterone injections in the past for secondary male hypogonadism and these had significantly improved his libido and energy levels.  Initial testosterone level back in 2018 was 192.  This was confirmed by retesting. Was on testosterone cypionate injections 200 mg once weekly but stopped this in 2021.  He had 3 injections left over at home and restarted 200 mg IM weekly about a month ago.  Is not clear when his most recent injection was but sounds like about 10 days ago.  PMP AWARE reviewed today:  NOTHING IS LISTED. No red flags.  Past Medical History:  Diagnosis Date   Anxiety and depression 2019   Improved with low dose sertraline and clonaz   Atypical chest pain 01/2019   ordered stress test but denied by insurer   Chronic fatigue    COVID-19 virus infection 10/2019   DDD (degenerative disc disease), lumbar    L4-5 per pt (did PT at [facility])   Elevated PSA 12/2019   Recommended urology referral 12/12/19   Family history of colon cancer    Sister at age 62   Hx of adenomatous polyp of colon 2012/2015/2020   2012: tubular adenoma.  2015 Benign polyp--09/2013 ([facility]). 01/2019 adenomatous polyp (Dr. Loletha Carrow, [facility]) recall 5 yrs.   Hyperlipidemia    Great response to statin.   Hypogonadism male    Obesity, Class II, BMI 35-39.9    Prediabetes 07/2021   07/2021 a1c 6%   Right ankle pain 03/2017   x-ray c/w soft tissue swelling anteriorly--? tendonitis.  Some osteoarthritis changes as well.-->Total ankle arthroplasty 10/2019.    Past Surgical History:  Procedure Laterality Date   COLONOSCOPY     COLONOSCOPY W/ POLYPECTOMY  08/2010; 09/2013; 02/04/19   polypectomy (adenomatous) 01/2019->Recall 5 yrs    CPAP  12/2020   CT CORONARY CA SCORING  2020   Score 68.4,nonobst cad, echo 04/2019 normal.   POLYPECTOMY     TOTAL ANKLE ARTHROPLASTY Right 10/14/2019   TOTAL KNEE ARTHROPLASTY Left 05/18/2019   Dr. Tacey Ruiz ([facility])   TRANSTHORACIC ECHOCARDIOGRAM  04/15/2019   EF 55-60%, nl wall motion.    Outpatient Medications Prior to Visit  Medication Sig Dispense Refill   atorvastatin (LIPITOR) 80 MG tablet Take 1 tablet (80 mg total) by mouth daily. 90 tablet 3   buPROPion (WELLBUTRIN XL) 300 MG 24 hr tablet Take 1 tablet (300 mg total) by mouth daily. 90 tablet 1   metroNIDAZOLE (METROCREAM) 0.75 % cream Apply 1 application topically 2 (two) times daily.     No facility-administered medications prior to visit.    No Known Allergies  ROS As per HPI  PE:    07/10/2022    1:20 PM 07/10/2022    1:18 PM 03/21/2022    8:21 AM  Vitals with BMI  Height   '5\' 11"'$   Weight  247 lbs 3 oz 238 lbs 13 oz  BMI   16.10  Systolic 960 454 098  Diastolic 78 80 75  Pulse  79 76     Physical Exam  Gen: Alert, well appearing.  Patient is oriented to person, place, time, and situation. AFFECT: pleasant, lucid thought and speech. No further exam today  LABS:  Last CBC Lab Results  Component Value Date   WBC 8.0 03/21/2022   HGB 14.8 03/21/2022   HCT 43.6 03/21/2022   MCV 88.7 03/21/2022   MCH 29.7 01/18/2022   RDW 13.5 03/21/2022   PLT 276.0 17/71/1657   Last metabolic panel Lab Results  Component Value Date   GLUCOSE 102 (H) 03/21/2022   NA 139 03/21/2022   K 4.6 03/21/2022   CL 100 03/21/2022   CO2 31 03/21/2022   BUN 17 03/21/2022   CREATININE 1.22 03/21/2022   GFRNONAA >60 01/18/2022   CALCIUM 9.7 03/21/2022   PROT 7.1 03/21/2022   ALBUMIN 4.5 03/21/2022   BILITOT 1.0 03/21/2022   ALKPHOS 63 03/21/2022   AST 22 03/21/2022   ALT 27 03/21/2022   ANIONGAP 10 01/18/2022   Lab Results  Component Value Date   TESTOSTERONE 303.09 01/11/2021   Lab Results  Component  Value Date   PSA1 1.1 02/29/2020   PSA 1.31 03/21/2022   PSA 1.06 01/11/2021   PSA 10.82 (H) 12/09/2019   IMPRESSION AND PLAN:  Secondary male hypogonadism. Has been off testosterone since 2021. Poor libido, decreased energy chronically.  Wants to restart testosterone injections. He has restarted these with leftover medication at home recently, approximate date of most recent injection was about 10 days ago, 200 mg. CBC and PSA up-to-date. Testosterone level check today. New rx today for '200mg'$  IM q week, #20 ml, no RF. We will do any appropriate dose adjustment based on testosterone level today.  An After Visit Summary was printed and given to the patient.  FOLLOW UP: No follow-ups on file.  Signed:  Crissie Sickles, MD           07/10/2022

## 2022-08-05 ENCOUNTER — Other Ambulatory Visit: Payer: Self-pay | Admitting: Family Medicine

## 2022-11-03 ENCOUNTER — Other Ambulatory Visit: Payer: Self-pay | Admitting: Family Medicine

## 2022-11-24 ENCOUNTER — Encounter: Payer: Self-pay | Admitting: *Deleted

## 2022-12-01 ENCOUNTER — Other Ambulatory Visit: Payer: Self-pay | Admitting: Family Medicine

## 2023-01-05 ENCOUNTER — Other Ambulatory Visit: Payer: Self-pay | Admitting: Family Medicine

## 2023-01-15 ENCOUNTER — Telehealth: Payer: 59 | Admitting: Family Medicine

## 2023-01-15 ENCOUNTER — Encounter: Payer: Self-pay | Admitting: Family Medicine

## 2023-01-15 VITALS — Wt 245.0 lb

## 2023-01-15 DIAGNOSIS — F3342 Major depressive disorder, recurrent, in full remission: Secondary | ICD-10-CM

## 2023-01-15 DIAGNOSIS — F411 Generalized anxiety disorder: Secondary | ICD-10-CM | POA: Diagnosis not present

## 2023-01-15 DIAGNOSIS — E291 Testicular hypofunction: Secondary | ICD-10-CM | POA: Diagnosis not present

## 2023-01-15 MED ORDER — TESTOSTERONE CYPIONATE 200 MG/ML IM SOLN
INTRAMUSCULAR | 0 refills | Status: DC
Start: 2023-01-15 — End: 2023-03-25

## 2023-01-15 MED ORDER — BUPROPION HCL ER (XL) 150 MG PO TB24: 150.0000 mg | ORAL_TABLET | Freq: Every day | ORAL | 2 refills | Status: DC

## 2023-01-15 NOTE — Progress Notes (Signed)
Virtual Visit via Video Note  I connected with Joshua Mcconnell  on 01/15/23 at  8:40 AM EDT by a video enabled telemedicine application and verified that I am speaking with the correct person using two identifiers.  Location patient: [place] Location provider:work or home office Persons participating in the virtual visit: patient, provider  I discussed the limitations and requested verbal permission for telemedicine visit. The patient expressed understanding and agreed to proceed.   HPI: 62 year old male being seen today for follow-up hypogonadism and anxiety and depression.  Doing well lately, from an anxiety and depression standpoint he does feel like he wants to try weaning down to the 150 mg Wellbutrin XL dose.  After last visit he took his testosterone 1 mL weekly and was feeling better, libido improved and energy improved.  He ran out of the medication about 2 months ago.  He wants to get back on this.  PMP AWARE reviewed today: most recent rx for testosterone was filled 10/12/2022, # 4 mL, rx by me. No red flags.  ROS: See pertinent positives and negatives per HPI.  Past Medical History:  Diagnosis Date   Anxiety and depression 2019   Improved with low dose sertraline and clonaz   Atypical chest pain 01/2019   ordered stress test but denied by insurer   Chronic fatigue    COVID-19 virus infection 10/2019   DDD (degenerative disc disease), lumbar    L4-5 per pt (did PT at [facility])   Elevated PSA 12/2019   Recommended urology referral 12/12/19   Family history of colon cancer    Sister at age 48   Hx of adenomatous polyp of colon 2012/2015/2020   2012: tubular adenoma.  2015 Benign polyp--09/2013 ([facility]). 01/2019 adenomatous polyp (Dr. Myrtie Neither, [facility]) recall 5 yrs.   Hyperlipidemia    Great response to statin.   Hypogonadism male    Obesity, Class II, BMI 35-39.9    Prediabetes 07/2021   07/2021 a1c 6%   Right ankle pain 03/2017   x-ray c/w soft tissue swelling  anteriorly--? tendonitis.  Some osteoarthritis changes as well.-->Total ankle arthroplasty 10/2019.    Past Surgical History:  Procedure Laterality Date   COLONOSCOPY     COLONOSCOPY W/ POLYPECTOMY  08/2010; 09/2013; 02/04/19   polypectomy (adenomatous) 01/2019->Recall 5 yrs   CPAP  12/2020   CT CORONARY CA SCORING  2020   Score 68.4,nonobst cad, echo 04/2019 normal.   POLYPECTOMY     TOTAL ANKLE ARTHROPLASTY Right 10/14/2019   TOTAL KNEE ARTHROPLASTY Left 05/18/2019   Dr. Doristine Counter ([facility])   TRANSTHORACIC ECHOCARDIOGRAM  04/15/2019   EF 55-60%, nl wall motion.     Current Outpatient Medications:    atorvastatin (LIPITOR) 80 MG tablet, Take 1 tablet (80 mg total) by mouth daily., Disp: 90 tablet, Rfl: 3   buPROPion (WELLBUTRIN XL) 150 MG 24 hr tablet, Take 1 tablet (150 mg total) by mouth daily., Disp: 30 tablet, Rfl: 2   metroNIDAZOLE (METROCREAM) 0.75 % cream, Apply 1 application topically 2 (two) times daily., Disp: , Rfl:    testosterone cypionate (DEPOTESTOSTERONE CYPIONATE) 200 MG/ML injection, 1 ml IM q week, Disp: 10 mL, Rfl: 0  EXAM:  VITALS per patient if applicable:     01/15/2023    8:31 AM 07/10/2022    1:20 PM 07/10/2022    1:18 PM  Vitals with BMI  Weight 245 lbs  247 lbs 3 oz  Systolic  119 162  Diastolic  78 80  Pulse  79   Body mass index is 34.17 kg/m.   GENERAL: alert, oriented, appears well and in no acute distress  HEENT: atraumatic, conjunttiva clear, no obvious abnormalities on inspection of external nose and ears  NECK: normal movements of the head and neck  LUNGS: on inspection no signs of respiratory distress, breathing rate appears normal, no obvious gross SOB, gasping or wheezing  CV: no obvious cyanosis  MS: moves all visible extremities without noticeable abnormality  PSYCH/NEURO: pleasant and cooperative, no obvious depression or anxiety, speech and thought processing grossly intact  LABS: none today   Chemistry      Component  Value Date/Time   NA 139 03/21/2022 0840   NA 139 05/04/2019 0000   K 4.6 03/21/2022 0840   CL 100 03/21/2022 0840   CO2 31 03/21/2022 0840   BUN 17 03/21/2022 0840   BUN 15 05/04/2019 0000   CREATININE 1.22 03/21/2022 0840   CREATININE 1.09 03/11/2017 1327   GLU 104 05/04/2019 0000      Component Value Date/Time   CALCIUM 9.7 03/21/2022 0840   ALKPHOS 63 03/21/2022 0840   AST 22 03/21/2022 0840   ALT 27 03/21/2022 0840   BILITOT 1.0 03/21/2022 0840     Lab Results  Component Value Date   WBC 8.0 03/21/2022   HGB 14.8 03/21/2022   HCT 43.6 03/21/2022   MCV 88.7 03/21/2022   PLT 276.0 03/21/2022   Lab Results  Component Value Date   PSA1 1.1 02/29/2020   PSA 1.31 03/21/2022   PSA 1.06 01/11/2021   PSA 10.82 (H) 12/09/2019   Lab Results  Component Value Date   HGBA1C 5.7 03/21/2022   Lab Results  Component Value Date   TESTOSTERONE 866.61 07/10/2022   ASSESSMENT AND PLAN:  Discussed the following assessment and plan:  #1 GAD and recurrent major depressive disorder. He is improving.  Will increase Wellbutrin XL 150 mg daily dose.  He is interested in complete wean off this medicine eventually and we will readdress when I see him back for follow-up in 2 months.  2.  Male hypogonadism. Will get him back on his testosterone at 200 mg IM weekly which was his previous dosing. Will recheck testosterone level, CBC, and PSA along with his usual complete physical lab panel when I see him back in 2 months.   I discussed the assessment and treatment plan with the patient. The patient was provided an opportunity to ask questions and all were answered. The patient agreed with the plan and demonstrated an understanding of the instructions.   F/u: 2 months for CPE  Signed:  Santiago Bumpers, MD           01/15/2023

## 2023-02-04 ENCOUNTER — Other Ambulatory Visit: Payer: Self-pay | Admitting: Family Medicine

## 2023-02-06 ENCOUNTER — Other Ambulatory Visit: Payer: Self-pay | Admitting: Family Medicine

## 2023-02-07 ENCOUNTER — Encounter: Payer: Self-pay | Admitting: Family Medicine

## 2023-03-11 ENCOUNTER — Encounter (INDEPENDENT_AMBULATORY_CARE_PROVIDER_SITE_OTHER): Payer: Self-pay

## 2023-03-11 ENCOUNTER — Other Ambulatory Visit: Payer: Self-pay | Admitting: Family Medicine

## 2023-03-24 ENCOUNTER — Ambulatory Visit: Payer: 59 | Admitting: Family Medicine

## 2023-03-24 ENCOUNTER — Encounter: Payer: Self-pay | Admitting: Family Medicine

## 2023-03-24 VITALS — BP 151/96 | HR 65 | Wt 248.4 lb

## 2023-03-24 DIAGNOSIS — R7303 Prediabetes: Secondary | ICD-10-CM | POA: Diagnosis not present

## 2023-03-24 DIAGNOSIS — K429 Umbilical hernia without obstruction or gangrene: Secondary | ICD-10-CM | POA: Diagnosis not present

## 2023-03-24 DIAGNOSIS — Z125 Encounter for screening for malignant neoplasm of prostate: Secondary | ICD-10-CM

## 2023-03-24 DIAGNOSIS — E669 Obesity, unspecified: Secondary | ICD-10-CM | POA: Diagnosis not present

## 2023-03-24 DIAGNOSIS — Z0000 Encounter for general adult medical examination without abnormal findings: Secondary | ICD-10-CM | POA: Diagnosis not present

## 2023-03-24 DIAGNOSIS — Z7689 Persons encountering health services in other specified circumstances: Secondary | ICD-10-CM

## 2023-03-24 DIAGNOSIS — E291 Testicular hypofunction: Secondary | ICD-10-CM

## 2023-03-24 DIAGNOSIS — R058 Other specified cough: Secondary | ICD-10-CM | POA: Diagnosis not present

## 2023-03-24 DIAGNOSIS — F418 Other specified anxiety disorders: Secondary | ICD-10-CM

## 2023-03-24 DIAGNOSIS — K219 Gastro-esophageal reflux disease without esophagitis: Secondary | ICD-10-CM

## 2023-03-24 DIAGNOSIS — E7800 Pure hypercholesterolemia, unspecified: Secondary | ICD-10-CM

## 2023-03-24 LAB — COMPREHENSIVE METABOLIC PANEL
ALT: 28 U/L (ref 0–53)
AST: 28 U/L (ref 0–37)
Albumin: 4.5 g/dL (ref 3.5–5.2)
Alkaline Phosphatase: 48 U/L (ref 39–117)
BUN: 16 mg/dL (ref 6–23)
CO2: 28 mEq/L (ref 19–32)
Calcium: 9.8 mg/dL (ref 8.4–10.5)
Chloride: 100 mEq/L (ref 96–112)
Creatinine, Ser: 1.02 mg/dL (ref 0.40–1.50)
GFR: 80.3 mL/min (ref >60.00)
Glucose, Bld: 106 mg/dL — ABNORMAL HIGH (ref 70–99)
Potassium: 4.2 mEq/L (ref 3.5–5.1)
Sodium: 137 mEq/L (ref 135–145)
Total Bilirubin: 1.3 mg/dL — ABNORMAL HIGH (ref 0.2–1.2)
Total Protein: 7 g/dL (ref 6.0–8.3)

## 2023-03-24 LAB — CBC WITH DIFFERENTIAL/PLATELET
Basophils Absolute: 0.1 10*3/uL (ref 0.0–0.1)
Basophils Relative: 0.9 % (ref 0.0–3.0)
Eosinophils Absolute: 0.1 10*3/uL (ref 0.0–0.7)
Eosinophils Relative: 1.1 % (ref 0.0–5.0)
HCT: 48.2 % (ref 39.0–52.0)
Hemoglobin: 16.2 g/dL (ref 13.0–17.0)
Lymphocytes Relative: 33.2 % (ref 12.0–46.0)
Lymphs Abs: 2.3 10*3/uL (ref 0.7–4.0)
MCHC: 33.7 g/dL (ref 30.0–36.0)
MCV: 87.3 fl (ref 78.0–100.0)
Monocytes Absolute: 0.6 10*3/uL (ref 0.1–1.0)
Monocytes Relative: 8.4 % (ref 3.0–12.0)
Neutro Abs: 3.9 10*3/uL (ref 1.4–7.7)
Neutrophils Relative %: 56.4 % (ref 43.0–77.0)
Platelets: 269 10*3/uL (ref 150.0–400.0)
RBC: 5.52 Mil/uL (ref 4.22–5.81)
RDW: 13.1 % (ref 11.5–15.5)
WBC: 6.9 10*3/uL (ref 4.0–10.5)

## 2023-03-24 LAB — LIPID PANEL
Cholesterol: 147 mg/dL (ref 0–200)
HDL: 55.2 mg/dL (ref >39.00)
LDL Cholesterol: 77 mg/dL (ref 0–99)
NonHDL: 92.25
Total CHOL/HDL Ratio: 3
Triglycerides: 76 mg/dL (ref 0.0–149.0)
VLDL: 15.2 mg/dL (ref 0.0–40.0)

## 2023-03-24 LAB — TSH: TSH: 2.36 u[IU]/mL (ref 0.35–5.50)

## 2023-03-24 LAB — TESTOSTERONE: Testosterone: 258.14 ng/dL — ABNORMAL LOW (ref 300.00–890.00)

## 2023-03-24 LAB — PSA: PSA: 1.75 ng/mL (ref 0.10–4.00)

## 2023-03-24 LAB — HEMOGLOBIN A1C: Hgb A1c MFr Bld: 5.4 % (ref 4.6–6.5)

## 2023-03-24 MED ORDER — PANTOPRAZOLE SODIUM 40 MG PO TBEC: 40.0000 mg | DELAYED_RELEASE_TABLET | Freq: Every day | ORAL | 1 refills | Status: DC

## 2023-03-24 MED ORDER — LORAZEPAM 0.5 MG PO TABS: ORAL_TABLET | ORAL | 1 refills | Status: DC

## 2023-03-24 MED ORDER — SEMAGLUTIDE(0.25 OR 0.5MG/DOS) 2 MG/3ML ~~LOC~~ SOPN: PEN_INJECTOR | SUBCUTANEOUS | 0 refills | Status: DC

## 2023-03-24 NOTE — Progress Notes (Signed)
Office Note 03/24/2023  CC:  Chief Complaint  Patient presents with   Cough    Has had this cough since 2021 from having Covid; Cough will clear on its own but always comes back. Pt states he also has a belly-button hernia that is concerning to him. Pt is fasting.     HPI:  Patient is a 62 y.o. male who is here for annual health maintenance exam, follow-up hypercholesterolemia and hypogonadism, and evaluate cough.  Most recent testosterone injection was about 7 to 10 days ago.   He has a dry hacking cough that seems sporadic.  Usually happens a few times a day.  No shortness of breath or wheezing.  Feels like something is in the back of his throat. He clears his throat a lot.  He has a hoarseness to his voice.  This is all been going on since he had COVID about 3 years ago. No fever, no hemoptysis, no chest pain, no DOE. No persistent heartburn problems.  Has a small umbilical hernia wants me to take a look at.  It does not bother him significantly at all. He has history of depression which has been improved significantly on Wellbutrin. He is currently on 150 mg a day.  He does not really have any chronic anxiety but notes some increased irritability and agitation periodically, triggered by general life stressors, particularly work-related.  Past Medical History:  Diagnosis Date   Anxiety and depression 2019   Improved with low dose sertraline and clonaz   Atypical chest pain 01/2019   ordered stress test but denied by insurer   Chronic fatigue    COVID-19 virus infection 10/2019   DDD (degenerative disc disease), lumbar    L4-5 per pt (did PT at [facility])   Elevated PSA 12/2019   Recommended urology referral 12/12/19   Family history of colon cancer    Sister at age 46   Hx of adenomatous polyp of colon 2012/2015/2020   2012: tubular adenoma.  2015 Benign polyp--09/2013 ([facility]). 01/2019 adenomatous polyp (Dr. Myrtie Neither, [facility]) recall 5 yrs.   Hyperlipidemia     Great response to statin.   Hypogonadism male    Obesity, Class II, BMI 35-39.9    Prediabetes 07/2021   07/2021 a1c 6%   Right ankle pain 03/2017   x-ray c/w soft tissue swelling anteriorly--? tendonitis.  Some osteoarthritis changes as well.-->Total ankle arthroplasty 10/2019.    Past Surgical History:  Procedure Laterality Date   COLONOSCOPY     COLONOSCOPY W/ POLYPECTOMY  08/2010; 09/2013; 02/04/19   polypectomy (adenomatous) 01/2019->Recall 5 yrs   CPAP  12/2020   CT CORONARY CA SCORING  2020   Score 68.4,nonobst cad, echo 04/2019 normal.   POLYPECTOMY     TOTAL ANKLE ARTHROPLASTY Right 10/14/2019   TOTAL KNEE ARTHROPLASTY Left 05/18/2019   Dr. Doristine Counter ([facility])   TRANSTHORACIC ECHOCARDIOGRAM  04/15/2019   EF 55-60%, nl wall motion.    Family History  Problem Relation Age of Onset   Diabetes Mother    Arthritis Mother    Breast cancer Mother    Heart disease Father    Stroke Father    Hypertension Father    Colon cancer Sister 38   Colon polyps Brother    Arthritis Maternal Grandmother    Esophageal cancer Neg Hx    Rectal cancer Neg Hx    Stomach cancer Neg Hx     Social History   Socioeconomic History   Marital status:  Married    Spouse name: Not on file   Number of children: Not on file   Years of education: Not on file   Highest education level: Not on file  Occupational History   Not on file  Tobacco Use   Smoking status: Former    Types: Cigars   Smokeless tobacco: Never   Tobacco comments:    3 cigars per week  Vaping Use   Vaping status: Never Used  Substance and Sexual Activity   Alcohol use: Yes    Alcohol/week: 20.0 standard drinks of alcohol    Types: 10 Cans of beer, 10 Standard drinks or equivalent per week   Drug use: No   Sexual activity: Not on file  Other Topics Concern   Not on file  Social History Narrative   Married, 2 daughters.   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   Alc: 1-2 drinks most days.   Social Determinants of Health   Financial Resource Strain: Not on file  Food Insecurity: Not on file  Transportation Needs: Not on file  Physical Activity: Sufficiently Active (07/06/2022)   Exercise Vital Sign    Days of Exercise per Week: 3 days    Minutes of Exercise per Session: 90 min  Stress: No Stress Concern Present (07/06/2022)   [employer] of Occupational Health - Occupational Stress Questionnaire    Feeling of Stress : Only a little  Social Connections: Unknown (12/22/2021)   Received from [facility], [facility]   Social Network    Social Network: Not on file  Intimate Partner Violence: Unknown (11/13/2021)   Received from [facility], [facility]   HITS    Physically Hurt: Not on file    Insult or Talk Down To: Not on file    Threaten Physical Harm: Not on file    Scream or Curse: Not on file    Outpatient Medications Prior to Visit  Medication Sig Dispense Refill   atorvastatin (LIPITOR) 80 MG tablet TAKE 1 TABLET BY MOUTH EVERY DAY 30 tablet 0   buPROPion (WELLBUTRIN XL) 150 MG 24 hr tablet Take 1 tablet (150 mg total) by mouth daily. 30 tablet 2   metroNIDAZOLE (METROCREAM) 0.75 % cream Apply 1 application topically 2 (two) times daily.     testosterone cypionate (DEPOTESTOSTERONE CYPIONATE) 200 MG/ML injection 1 ml IM q week 10 mL 0   No facility-administered medications prior to visit.    No Known Allergies  Review of Systems  Constitutional:  Negative for appetite change, chills, fatigue and fever.  HENT:  Negative for congestion, dental problem, ear pain and sore throat.   Eyes:  Negative for discharge, redness and visual disturbance.  Respiratory:  Positive for cough. Negative for chest tightness, shortness of breath and wheezing.   Cardiovascular:  Negative for chest pain, palpitations and leg swelling.  Gastrointestinal:  Negative for abdominal pain, blood in stool, diarrhea, nausea and vomiting.   Genitourinary:  Negative for difficulty urinating, dysuria, flank pain, frequency, hematuria and urgency.  Musculoskeletal:  Negative for arthralgias, back pain, joint swelling, myalgias and neck stiffness.  Skin:  Negative for pallor and rash.  Neurological:  Negative for dizziness, speech difficulty, weakness and headaches.  Hematological:  Negative for adenopathy. Does not bruise/bleed easily.  Psychiatric/Behavioral:  Negative for confusion and sleep disturbance. The patient is nervous/anxious.     PE;    03/24/2023   10:46 AM 01/15/2023  8:31 AM 07/10/2022    1:20 PM  Vitals with BMI  Weight 248 lbs 6 oz 245 lbs   Systolic 151  119  Diastolic 96  78  Pulse 65     Initial bp today 151/96 Manual rpt 136/88 P 65  Gen: Alert, well appearing.  Patient is oriented to person, place, time, and situation. AFFECT: pleasant, lucid thought and speech. ENT: Ears: EACs clear, normal epithelium.  TMs with good light reflex and landmarks bilaterally.  Eyes: no injection, icteris, swelling, or exudate.  EOMI, PERRLA. Nose: no drainage or turbinate edema/swelling.  No injection or focal lesion.  Mouth: lips without lesion/swelling.  Oral mucosa pink and moist.  Dentition intact and without obvious caries or gingival swelling.  Oropharynx without erythema, exudate, or swelling.  Neck: supple/nontender.  No LAD, mass, or TM.  Carotid pulses 2+ bilaterally, without bruits. CV: RRR, no m/r/g.   LUNGS: CTA bilat, nonlabored resps, good aeration in all lung fields. ABD: soft, NT, ND, BS normal.  No hepatospenomegaly or mass.  No bruits.  Small reducible umbilical hernia EXT: no clubbing, cyanosis, or edema.  Musculoskeletal: no joint swelling, erythema, warmth, or tenderness.  ROM of all joints intact. Skin - no sores or suspicious lesions or rashes or color changes  Pertinent labs:  Lab Results  Component Value Date   TSH 2.00 03/21/2022   Lab Results  Component Value Date   WBC 8.0  03/21/2022   HGB 14.8 03/21/2022   HCT 43.6 03/21/2022   MCV 88.7 03/21/2022   PLT 276.0 03/21/2022   Lab Results  Component Value Date   CREATININE 1.22 03/21/2022   BUN 17 03/21/2022   NA 139 03/21/2022   K 4.6 03/21/2022   CL 100 03/21/2022   CO2 31 03/21/2022   Lab Results  Component Value Date   ALT 27 03/21/2022   AST 22 03/21/2022   ALKPHOS 63 03/21/2022   BILITOT 1.0 03/21/2022   Lab Results  Component Value Date   CHOL 143 03/21/2022   Lab Results  Component Value Date   HDL 50.50 03/21/2022   Lab Results  Component Value Date   LDLCALC 72 03/21/2022   Lab Results  Component Value Date   TRIG 102.0 03/21/2022   Lab Results  Component Value Date   CHOLHDL 3 03/21/2022   Lab Results  Component Value Date   PSA 1.31 03/21/2022   PSA 1.06 01/11/2021   PSA 10.82 (H) 12/09/2019   Lab Results  Component Value Date   TESTOSTERONE 866.61 07/10/2022   ASSESSMENT AND PLAN:   #1 health maintenance exam: Reviewed age and gender appropriate health maintenance issues (prudent diet, regular exercise, health risks of tobacco and excessive alcohol, use of seatbelts, fire alarms in home, use of sunscreen).  Also reviewed age and gender appropriate health screening as well as vaccine recommendations. Vaccines: All up-to-date Labs: Fasting health panel, PSA, hemoglobin A1c. Prostate ca screening: PSA level today. Colon ca screening: recall 2025  #2 upper airway cough syndrome. Start pantoprazole 40 mg a day. Elevate the head of your bed (ideally with 6 inch  bed blocks),  Smoking cessation, avoidance of late meals, excessive alcohol, and avoid fatty foods, chocolate, peppermint, colas, red wine, and acidic juices such as orange juice.  NO MINT OR MENTHOL PRODUCTS SO NO COUGH DROPS   USE SUGARLESS CANDY INSTEAD (Jolley ranchers or Stover's or Life Savers) or even ice chips will also do - the key is to swallow to prevent all  throat clearing. NO OIL BASED VITAMINS -  use powdered substitutes.  #3 secondary male hypogonadism. Has been on testosterone 200 mg weekly. Most recent injection 7 to 10 days ago. Check testosterone level and monitor hemoglobin today.  4.  Hypercholesterolemia, doing well on atorvastatin 80 mg a day. Lipid panel and hepatic panel today.  5.  Obesity, class I. He has done well with lifestyle modification over the last couple of years.  He is down from the 280s to now weighing 247. He would like to try GLP-1 agonist. Ozempic 0.5 mg SQ weekly prescribed. Therapeutic expectations and side effect profile of medication discussed today.  Patient's questions answered.  #6 umbilical hernia.  Asymptomatic. Observe.  #7 situational anxiety. Trial of lorazepam 0.5 mg, 1-2 twice daily as needed, #30, refill x 1. Therapeutic expectations and side effect profile of medication discussed today.  Patient's questions answered.  #8 recurrent major depressive disorder, in remission. He will continue on Wellbutrin XL 150 mg daily but his goal is to get off this medication completely in about 6 months.  An After Visit Summary was printed and given to the patient.  FOLLOW UP:  Return in about 4 weeks (around 04/21/2023) for f/u ozempic, anx, cough.  Signed:  Santiago Bumpers, MD           03/24/2023

## 2023-03-24 NOTE — Addendum Note (Signed)
Addended by: Jeoffrey Massed on: 03/24/2023 11:33 AM   Modules accepted: Orders

## 2023-03-24 NOTE — Patient Instructions (Signed)
Elevate the head of your bed (ideally with 6 inch  bed blocks),  Smoking cessation, avoidance of late meals, excessive alcohol, and avoid fatty foods, chocolate, peppermint, colas, red wine, and acidic juices such as orange juice.  NO MINT OR MENTHOL PRODUCTS SO NO COUGH DROPS   USE SUGARLESS CANDY INSTEAD (Jolley ranchers or Stover's or Life Savers) or even ice chips will also do - the key is to swallow to prevent all throat clearing. NO OIL BASED VITAMINS - use powdered substitutes.    

## 2023-03-25 ENCOUNTER — Encounter: Payer: Self-pay | Admitting: Family Medicine

## 2023-03-25 ENCOUNTER — Other Ambulatory Visit: Payer: Self-pay | Admitting: Family Medicine

## 2023-03-25 MED ORDER — TESTOSTERONE CYPIONATE 200 MG/ML IM SOLN: INTRAMUSCULAR | 0 refills | Status: DC

## 2023-03-30 IMAGING — CR DG CHEST 2V
2 series · 2 of 2 positions shown · non-contrast
Comparison: None Available.

CLINICAL DATA: Dyspnea

EXAM:
CHEST - 2 VIEW

[chest pa]
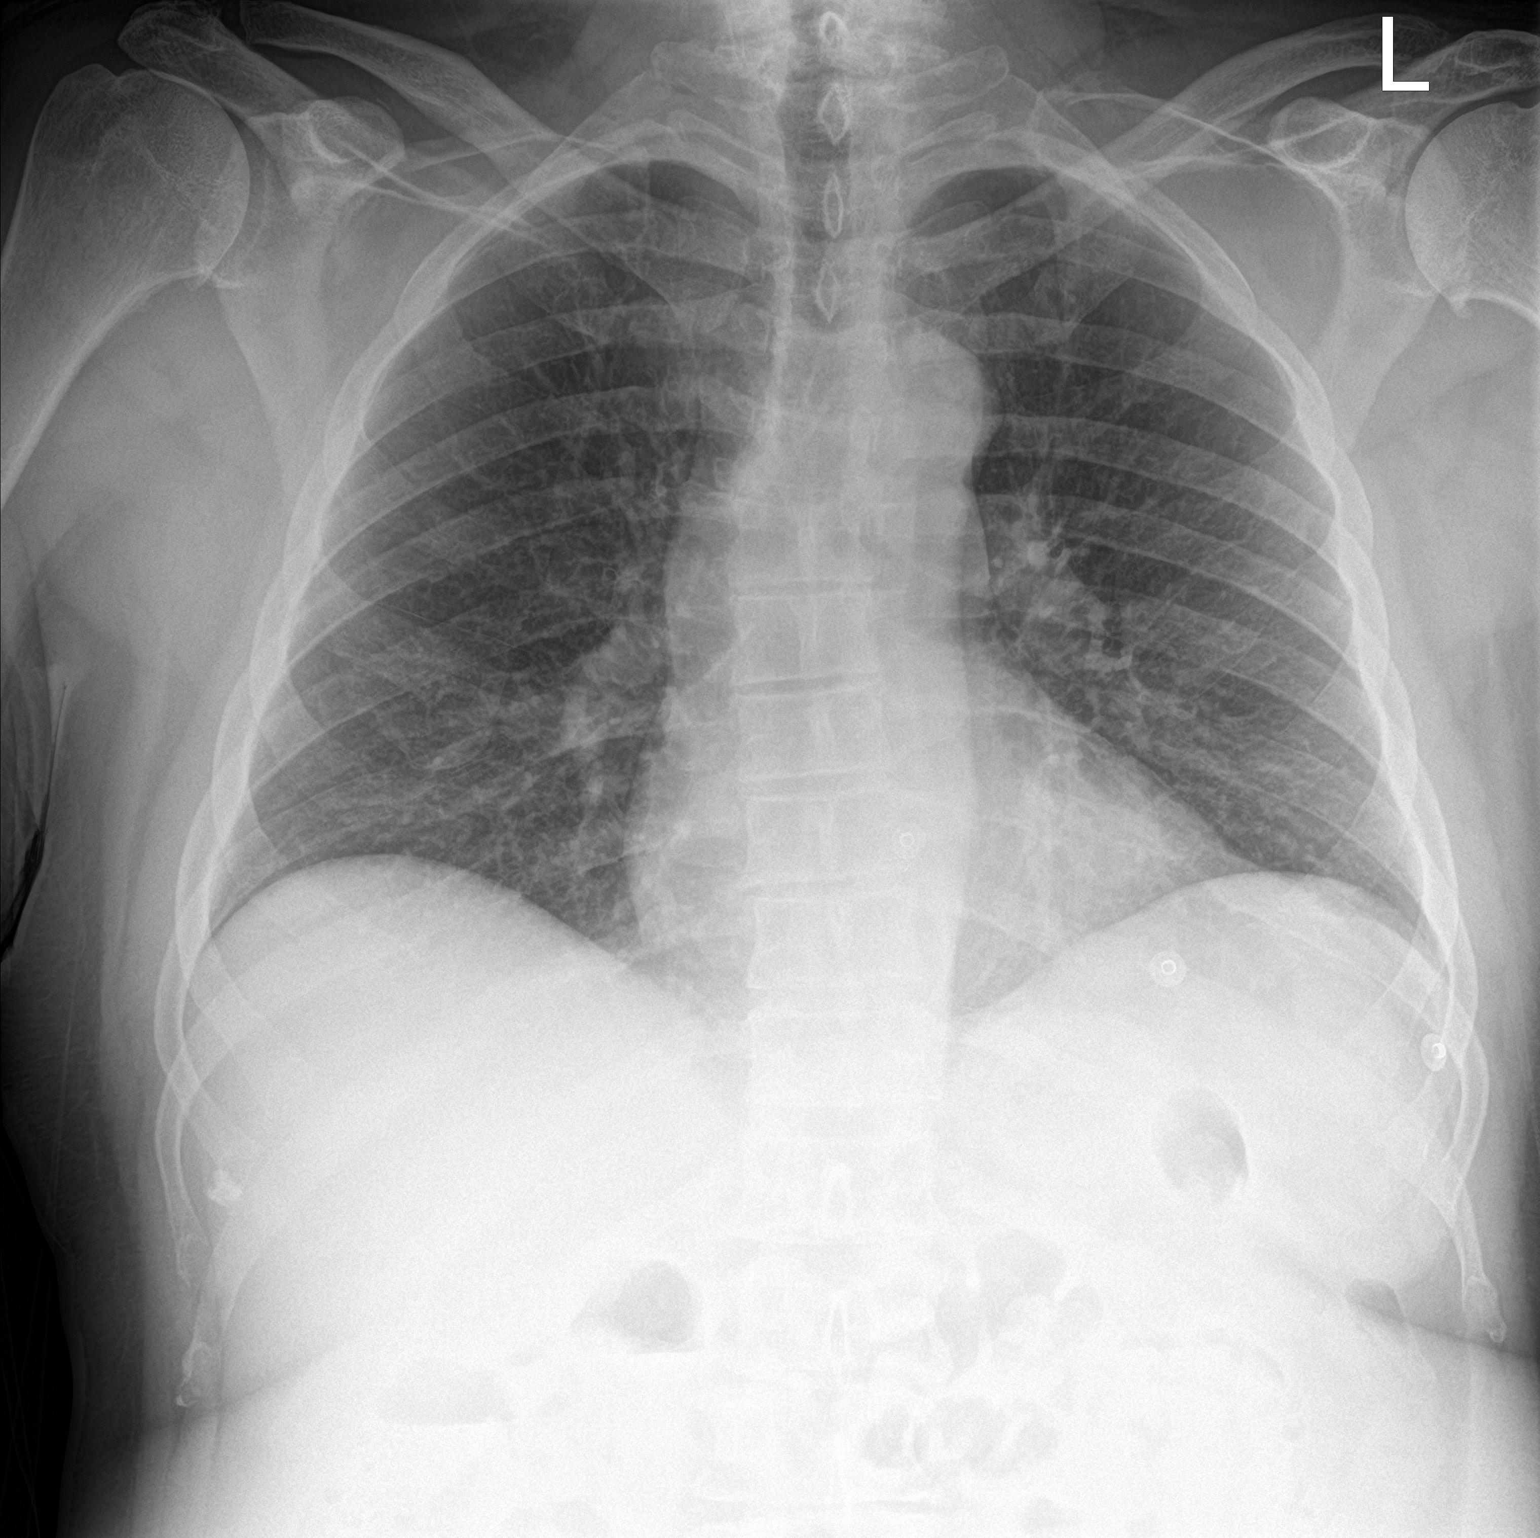

[chest lat]
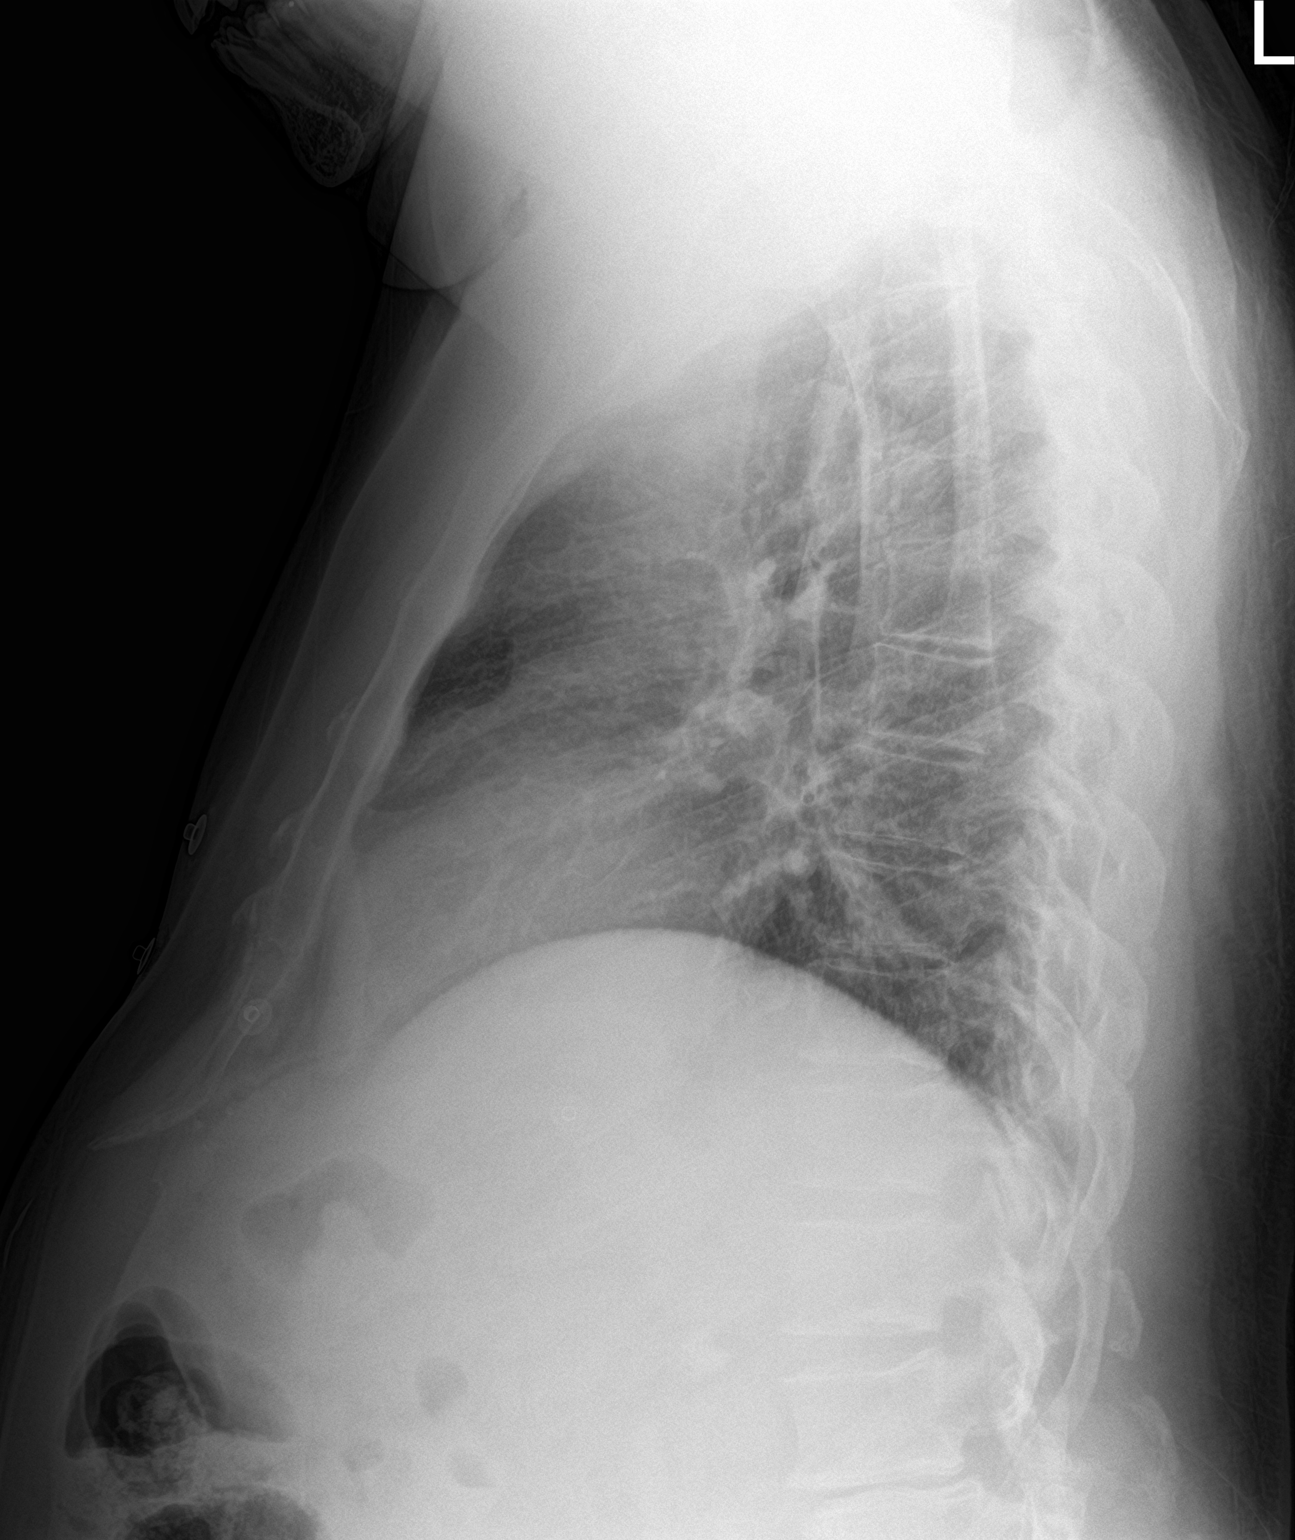

[2 of 2 positions shown; findings below may reference images not displayed]

FINDINGS: The heart size and mediastinal contours are within normal limits.
Both lungs are clear. The visualized skeletal structures are
unremarkable.
IMPRESSION: No active cardiopulmonary disease.

## 2023-04-10 ENCOUNTER — Other Ambulatory Visit: Payer: Self-pay | Admitting: Family Medicine

## 2023-04-14 ENCOUNTER — Other Ambulatory Visit: Payer: Self-pay | Admitting: Family Medicine

## 2023-04-14 MED ORDER — ATORVASTATIN CALCIUM 80 MG PO TABS: 80.0000 mg | ORAL_TABLET | Freq: Every day | ORAL | 1 refills | Status: DC

## 2023-04-15 ENCOUNTER — Encounter: Payer: Self-pay | Admitting: Family Medicine

## 2023-04-15 ENCOUNTER — Other Ambulatory Visit: Payer: Self-pay | Admitting: Family Medicine

## 2023-05-20 ENCOUNTER — Other Ambulatory Visit: Payer: Self-pay | Admitting: Family Medicine

## 2023-05-20 ENCOUNTER — Other Ambulatory Visit (HOSPITAL_COMMUNITY): Payer: Self-pay

## 2023-06-06 ENCOUNTER — Other Ambulatory Visit: Payer: Self-pay | Admitting: Family Medicine

## 2023-06-25 ENCOUNTER — Other Ambulatory Visit: Payer: Self-pay | Admitting: Family Medicine

## 2023-06-25 NOTE — Telephone Encounter (Signed)
Sent my chart. Pt is due for appt

## 2023-07-02 ENCOUNTER — Other Ambulatory Visit: Payer: Self-pay | Admitting: Family Medicine

## 2023-07-02 NOTE — Telephone Encounter (Signed)
Pt is over due for follow up since 04/21/23 pt was notified via mychart on 11/14 to schedule OV

## 2023-07-02 NOTE — Telephone Encounter (Signed)
Okay, testosterone prescription sent

## 2023-08-03 ENCOUNTER — Other Ambulatory Visit: Payer: Self-pay | Admitting: Family Medicine

## 2023-08-09 ENCOUNTER — Other Ambulatory Visit: Payer: Self-pay | Admitting: Family Medicine

## 2023-08-30 ENCOUNTER — Other Ambulatory Visit: Payer: Self-pay | Admitting: Family Medicine

## 2023-09-08 ENCOUNTER — Other Ambulatory Visit: Payer: Self-pay | Admitting: Family Medicine

## 2023-09-08 MED ORDER — LORAZEPAM 0.5 MG PO TABS: ORAL_TABLET | ORAL | 0 refills | Status: DC

## 2023-09-08 NOTE — Telephone Encounter (Signed)
Copied from CRM [id number]. Topic: Clinical - Medication Refill >> Sep 08, 2023 12:14 PM Irine Seal wrote: Most Recent Primary Care Visit:  Provider: Jeoffrey Massed  Department: [facility]  Visit Type: OFFICE VISIT  Date: 03/24/2023  Medication: LORazepam (ATIVAN) 0.5 MG tablet  Has the patient contacted their pharmacy? Yes- stated he needs to contact PCP for approval  (Agent: If no, request that the patient contact the pharmacy for the refill. If patient does not wish to contact the pharmacy document the reason why and proceed with request.) (Agent: If yes, when and what did the pharmacy advise?)  Is this the correct pharmacy for this prescription? Yes If no, delete pharmacy and type the correct one.  This is the patient's preferred pharmacy:  [id number] [place], [place] - [address] [place] [address] Phone: [phone] Fax: [phone]   Has the prescription been filled recently? Yes  Is the patient out of the medication? Yes  Has the patient been seen for an appointment in the last year OR does the patient have an upcoming appointment? yes  Can we respond through MyChart? Yes  Agent: Please be advised that Rx refills may take up to 3 business days. We ask that you follow-up with your pharmacy.

## 2023-09-08 NOTE — Telephone Encounter (Signed)
#  30 lorazepam sent. Pt needs f/u with me in the next 3-4 wks.

## 2023-10-29 ENCOUNTER — Other Ambulatory Visit: Payer: Self-pay | Admitting: Family Medicine

## 2023-11-17 ENCOUNTER — Other Ambulatory Visit: Payer: Self-pay | Admitting: Family Medicine

## 2023-12-18 ENCOUNTER — Ambulatory Visit (INDEPENDENT_AMBULATORY_CARE_PROVIDER_SITE_OTHER): Admitting: Family Medicine

## 2023-12-18 ENCOUNTER — Telehealth: Payer: Self-pay

## 2023-12-18 VITALS — BP 120/84 | HR 74 | Temp 98.9°F | Ht 71.0 in | Wt 251.4 lb

## 2023-12-18 DIAGNOSIS — E291 Testicular hypofunction: Secondary | ICD-10-CM | POA: Diagnosis not present

## 2023-12-18 DIAGNOSIS — F3342 Major depressive disorder, recurrent, in full remission: Secondary | ICD-10-CM

## 2023-12-18 DIAGNOSIS — E7800 Pure hypercholesterolemia, unspecified: Secondary | ICD-10-CM

## 2023-12-18 DIAGNOSIS — R7303 Prediabetes: Secondary | ICD-10-CM | POA: Diagnosis not present

## 2023-12-18 DIAGNOSIS — F418 Other specified anxiety disorders: Secondary | ICD-10-CM

## 2023-12-18 LAB — COMPREHENSIVE METABOLIC PANEL WITH GFR
ALT: 25 U/L (ref 0–53)
AST: 25 U/L (ref 0–37)
Albumin: 4.2 g/dL (ref 3.5–5.2)
Alkaline Phosphatase: 42 U/L (ref 39–117)
BUN: 18 mg/dL (ref 6–23)
CO2: 29 meq/L (ref 19–32)
Calcium: 9.1 mg/dL (ref 8.4–10.5)
Chloride: 101 meq/L (ref 96–112)
Creatinine, Ser: 1.13 mg/dL (ref 0.40–1.50)
GFR: 70.65 mL/min (ref >60.00)
Glucose, Bld: 102 mg/dL — ABNORMAL HIGH (ref 70–99)
Potassium: 4.5 meq/L (ref 3.5–5.1)
Sodium: 137 meq/L (ref 135–145)
Total Bilirubin: 1.1 mg/dL (ref 0.2–1.2)
Total Protein: 6.6 g/dL (ref 6.0–8.3)

## 2023-12-18 LAB — LIPID PANEL
Cholesterol: 238 mg/dL — ABNORMAL HIGH (ref 0–200)
HDL: 53.4 mg/dL (ref >39.00)
LDL Cholesterol: 163 mg/dL — ABNORMAL HIGH (ref 0–99)
NonHDL: 184.16
Total CHOL/HDL Ratio: 4
Triglycerides: 108 mg/dL (ref 0.0–149.0)
VLDL: 21.6 mg/dL (ref 0.0–40.0)

## 2023-12-18 LAB — CBC
HCT: 47.8 % (ref 39.0–52.0)
Hemoglobin: 15.6 g/dL (ref 13.0–17.0)
MCHC: 32.7 g/dL (ref 30.0–36.0)
MCV: 81.3 fl (ref 78.0–100.0)
Platelets: 258 10*3/uL (ref 150.0–400.0)
RBC: 5.87 Mil/uL — ABNORMAL HIGH (ref 4.22–5.81)
RDW: 16.1 % — ABNORMAL HIGH (ref 11.5–15.5)
WBC: 7 10*3/uL (ref 4.0–10.5)

## 2023-12-18 LAB — TESTOSTERONE: Testosterone: 705.57 ng/dL (ref 300.00–890.00)

## 2023-12-18 LAB — HEMOGLOBIN A1C: Hgb A1c MFr Bld: 6 % (ref 4.6–6.5)

## 2023-12-18 MED ORDER — ATORVASTATIN CALCIUM 80 MG PO TABS: 80.0000 mg | ORAL_TABLET | Freq: Every day | ORAL | 3 refills | Status: AC

## 2023-12-18 MED ORDER — LORAZEPAM 0.5 MG PO TABS: ORAL_TABLET | ORAL | 3 refills | Status: DC

## 2023-12-18 MED ORDER — SEMAGLUTIDE(0.25 OR 0.5MG/DOS) 2 MG/3ML ~~LOC~~ SOPN: PEN_INJECTOR | SUBCUTANEOUS | 5 refills | Status: DC

## 2023-12-18 MED ORDER — TESTOSTERONE CYPIONATE 200 MG/ML IM SOLN: INTRAMUSCULAR | 1 refills | Status: DC

## 2023-12-18 MED ORDER — BUPROPION HCL ER (XL) 150 MG PO TB24: 150.0000 mg | ORAL_TABLET | Freq: Every day | ORAL | 3 refills | Status: AC

## 2023-12-18 NOTE — Telephone Encounter (Signed)
 Pharmacy Patient Advocate Encounter   Received notification from CoverMyMeds that prior authorization for Ozempic  (0.25 or 0.5 MG/DOSE) 2MG /3ML pen-injectors  is required/requested.   Insurance verification completed.   The patient is insured through [facility] .   Per test claim: PA required; PA started via CoverMyMeds. KEY [id number] . Waiting for clinical questions to populate.

## 2023-12-18 NOTE — Progress Notes (Signed)
 OFFICE VISIT  12/18/2023  CC:  Chief Complaint  Patient presents with   Medical Management of Chronic Issues    Pt is fasting    Patient is a 63 y.o. male who presents for 74-month follow-up anxiety, hypercholesterolemia, prediabetes, obesity/weight management, and male hypogonadism. A/P as of last visit: "1 upper airway cough syndrome. Start pantoprazole  40 mg a day.   #2 secondary male hypogonadism. Has been on testosterone  200 mg weekly. Most recent injection 7 to 10 days ago. Check testosterone  level and monitor hemoglobin today.   3.  Hypercholesterolemia, doing well on atorvastatin  80 mg a day. Lipid panel and hepatic panel today.   4.  Obesity, class I. He has done well with lifestyle modification over the last couple of years.  He is down from the 280s to now weighing 247. He would like to try GLP-1 agonist. Ozempic  0.5 mg SQ weekly prescribed. Therapeutic expectations and side effect profile of medication discussed today.  Patient's questions answered.   #5 umbilical hernia.  Asymptomatic. Observe.   #6 situational anxiety. Trial of lorazepam  0.5 mg, 1-2 twice daily as needed, #30, refill x 1. Therapeutic expectations and side effect profile of medication discussed today.  Patient's questions answered.   #7 recurrent major depressive disorder, in remission. He will continue on Wellbutrin  XL 150 mg daily but his goal is to get off this medication completely in about 6 months."  INTERIM HX: Joshua Mcconnell feels well. He did not start the Ozempic  due to cost. He is working on losing weight by diet and exercise.  He has no acute concerns.  Most recent testosterone  injection was: 5 or 6 days ago. He feels like this medication is helping well with libido, energy, and sleep.   PMP AWARE reviewed today: most recent rx for testosterone  was filled 12/06/2023, # 4 mL, rx by me.  Most recent prescription for lorazepam  was filled 09/08/2023, #30, prescription by me. No red  flags.  ROS as above, plus--> no fevers, no CP, no SOB, no wheezing, no cough, no dizziness, no HAs, no rashes, no melena/hematochezia.  No polyuria or polydipsia.  No myalgias or arthralgias.  No focal weakness, paresthesias, or tremors.  No acute vision or hearing abnormalities.  No dysuria or unusual/new urinary urgency or frequency.  No recent changes in lower legs. No n/v/d or abd pain.  No palpitations.    Past Medical History:  Diagnosis Date   Anxiety and depression 2019   Improved with low dose sertraline  and clonaz   Atypical chest pain 01/2019   ordered stress test but denied by insurer   Chronic fatigue    COVID-19 virus infection 10/2019   DDD (degenerative disc disease), lumbar    L4-5 per pt (did PT at [facility])   Elevated PSA 12/2019   Recommended urology referral 12/12/19   Family history of colon cancer    Sister at age 30   Hx of adenomatous polyp of colon 2012/2015/2020   2012: tubular adenoma.  2015 Benign polyp--09/2013 ([facility]). 01/2019 adenomatous polyp (Dr. Dominic Friendly,  GI) recall 5 yrs.   Hyperlipidemia    Great response to statin.   Hypogonadism male    Laryngopharyngeal reflux (LPR)    Obesity, Class II, BMI 35-39.9    OSA on CPAP    Prediabetes 07/2021   07/2021 a1c 6%   Umbilical hernia     Past Surgical History:  Procedure Laterality Date   COLONOSCOPY     COLONOSCOPY W/ POLYPECTOMY  08/2010; 09/2013;  02/04/19   polypectomy (adenomatous) 01/2019->Recall 5 yrs   CPAP  12/2020   CT CORONARY CA SCORING  2020   Score 68.4,nonobst cad, echo 04/2019 normal.   POLYPECTOMY     TOTAL ANKLE ARTHROPLASTY Right 10/14/2019   TOTAL KNEE ARTHROPLASTY Left 05/18/2019   Dr. Dessie Flow ([facility])   TRANSTHORACIC ECHOCARDIOGRAM  04/15/2019   EF 55-60%, nl wall motion.    Outpatient Medications Prior to Visit  Medication Sig Dispense Refill   metroNIDAZOLE (METROCREAM) 0.75 % cream Apply 1 application topically 2 (two) times daily.     pantoprazole   (PROTONIX ) 40 MG tablet TAKE 1 TABLET BY MOUTH EVERY DAY 30 tablet 1   LORazepam  (ATIVAN ) 0.5 MG tablet 1-2 tabs po bid prn anxiety 30 tablet 0   atorvastatin  (LIPITOR) 80 MG tablet Take 1 tablet (80 mg total) by mouth daily. 90 tablet 1   buPROPion  (WELLBUTRIN  XL) 150 MG 24 hr tablet TAKE 1 TABLET BY MOUTH EVERY DAY 90 tablet 1   Semaglutide ,0.25 or 0.5MG /DOS, 2 MG/3ML SOPN 0.5mg  SQ q 7d 3 mL 0   testosterone  cypionate (DEPOTESTOSTERONE CYPIONATE) 200 MG/ML injection INJECT 1 ML INTRAMUSCULARLY ONE TIME PER WEEK. 10 mL 1   No facility-administered medications prior to visit.    No Known Allergies  Review of Systems As per HPI  PE:    12/18/2023    8:56 AM 12/18/2023    8:42 AM 03/24/2023   10:46 AM  Vitals with BMI  Height  5\' 11"    Weight  251 lbs 6 oz 248 lbs 6 oz  BMI  35.08   Systolic 120 143 409  Diastolic 84 84 96  Pulse  74 65   Physical Exam  Gen: Alert, well appearing.  Patient is oriented to person, place, time, and situation. AFFECT: pleasant, lucid thought and speech. No further exam today  LABS:  Last CBC Lab Results  Component Value Date   WBC 6.9 03/24/2023   HGB 16.2 03/24/2023   HCT 48.2 03/24/2023   MCV 87.3 03/24/2023   MCH 29.7 01/18/2022   RDW 13.1 03/24/2023   PLT 269.0 03/24/2023   Last metabolic panel Lab Results  Component Value Date   GLUCOSE 106 (H) 03/24/2023   NA 137 03/24/2023   K 4.2 03/24/2023   CL 100 03/24/2023   CO2 28 03/24/2023   BUN 16 03/24/2023   CREATININE 1.02 03/24/2023   GFR 80.30 03/24/2023   CALCIUM  9.8 03/24/2023   PROT 7.0 03/24/2023   ALBUMIN 4.5 03/24/2023   BILITOT 1.3 (H) 03/24/2023   ALKPHOS 48 03/24/2023   AST 28 03/24/2023   ALT 28 03/24/2023   ANIONGAP 10 01/18/2022   Last lipids Lab Results  Component Value Date   CHOL 147 03/24/2023   HDL 55.20 03/24/2023   LDLCALC 77 03/24/2023   LDLDIRECT 181.0 01/11/2021   TRIG 76.0 03/24/2023   CHOLHDL 3 03/24/2023   Last hemoglobin A1c Lab Results   Component Value Date   HGBA1C 5.4 03/24/2023   Last thyroid  functions Lab Results  Component Value Date   TSH 2.36 03/24/2023   Lab Results  Component Value Date   PSA1 1.1 02/29/2020   PSA 1.75 03/24/2023   PSA 1.31 03/21/2022   PSA 1.06 01/11/2021   IMPRESSION AND PLAN:  1. secondary male hypogonadism. Has been on testosterone  200 mg weekly. Most recent injection 5-6 d/a. Check testosterone  level and monitor hemoglobin today.   2.  Hypercholesterolemia, doing well on atorvastatin  80 mg a day.  Lipid panel and hepatic panel today.   3.  Obesity, class I. He has done well with lifestyle modification over the last couple of years.  He is down from the 280s to now weighing 240.   4.  recurrent major depressive disorder, in remission. He has some GAD with increased anxiety in certain situations. He will continue on Wellbutrin  XL 150 mg daily and lorazepam  0.5 mg, 1-2 twice daily as needed. #30, refill x 3 today.  #5 prediabetes. Working on therapeutic lifestyle changes. Fasting glucose and hemoglobin A1c today.  An After Visit Summary was printed and given to the patient.  FOLLOW UP: Return in about 6 months (around 06/19/2024) for annual CPE (fasting). Next CPE 03/18/2024 Signed:  Arletha Lady, MD           12/18/2023

## 2023-12-18 NOTE — Telephone Encounter (Signed)
 Patient is no longer taking this medication. No further action needed

## 2023-12-18 NOTE — Telephone Encounter (Signed)
 PLEASE BE ADVISED  Ozempic  is approved exclusively as an adjunct to diet and exercise to improve glycemic control in adults with type 2 diabetes mellitus. A review of patient's medical chart reveals NO documented diagnosis of type 2 diabetes or an A1C indicative of diabetes. Therefore, they do not currently meet the criteria for prior authorization of this medication. If clinically appropriate, alternative options such as Saxenda, Zepbound, or Wegovy  may be considered for this patient.

## 2023-12-22 ENCOUNTER — Ambulatory Visit: Payer: Self-pay

## 2023-12-23 NOTE — Telephone Encounter (Signed)
 I recommend restarting atorvastatin  80 mg a day. If prescription needed please do atorvastatin  80 mg, 1 tab p.o. daily, #90, refill x 3.

## 2024-01-11 ENCOUNTER — Other Ambulatory Visit (HOSPITAL_COMMUNITY): Payer: Self-pay

## 2024-01-12 ENCOUNTER — Other Ambulatory Visit (HOSPITAL_COMMUNITY): Payer: Self-pay

## 2024-02-02 ENCOUNTER — Encounter: Payer: Self-pay | Admitting: Gastroenterology

## 2024-02-21 ENCOUNTER — Encounter: Payer: Self-pay | Admitting: Family Medicine

## 2024-02-22 ENCOUNTER — Encounter: Payer: Self-pay | Admitting: Family Medicine

## 2024-02-22 MED ORDER — TESTOSTERONE CYPIONATE 200 MG/ML IM SOLN: INTRAMUSCULAR | 1 refills | Status: AC

## 2024-02-22 MED ORDER — LORAZEPAM 0.5 MG PO TABS: ORAL_TABLET | ORAL | 3 refills | Status: AC

## 2024-02-22 NOTE — Telephone Encounter (Signed)
 Lorazepam  and testosterone  prescriptions sent.  Please confirm whether or not you want to move up to the 1 mg Ozempic  dose.

## 2024-02-23 MED ORDER — SEMAGLUTIDE (1 MG/DOSE) 4 MG/3ML ~~LOC~~ SOPN: 1.0000 mg | PEN_INJECTOR | SUBCUTANEOUS | 3 refills | Status: AC

## 2024-02-23 NOTE — Telephone Encounter (Signed)
 Okay Ozempic  1 mg sent

## 2024-09-11 ENCOUNTER — Other Ambulatory Visit: Payer: Self-pay | Admitting: Family Medicine

## 2024-09-30 ENCOUNTER — Encounter: Admitting: Family Medicine
